# Patient Record
Sex: Female | Born: 1955 | Race: Black or African American | Hispanic: No | Marital: Single | Smoking: Current every day smoker
Health system: Southern US, Community
[De-identification: ages and names within clinical notes are randomized; demographics above are authoritative.]

## PROBLEM LIST (undated history)

## (undated) DIAGNOSIS — I10 Essential (primary) hypertension: Secondary | ICD-10-CM

## (undated) HISTORY — PX: CHOLECYSTECTOMY: SHX55

## (undated) HISTORY — PX: BRAIN SURGERY: SHX531

---

## 2014-09-23 ENCOUNTER — Emergency Department
Admit: 2014-09-23 | Disposition: A | Discharge status: Home / Self Care | Payer: Self-pay | Admitting: Emergency Medicine

## 2014-09-23 LAB — CBC WITH DIFFERENTIAL/PLATELET
Basophil #: 0 10*3/uL (ref 0.0–0.1)
Basophil %: 0.6 %
Eosinophil #: 0.1 10*3/uL (ref 0.0–0.7)
Eosinophil %: 1 %
HCT: 39.8 % (ref 35.0–47.0)
HGB: 13.9 g/dL (ref 12.0–16.0)
LYMPHS PCT: 31.6 %
Lymphocyte #: 1.7 10*3/uL (ref 1.0–3.6)
MCH: 27.3 pg (ref 26.0–34.0)
MCHC: 34.9 g/dL (ref 32.0–36.0)
MCV: 78 fL — ABNORMAL LOW (ref 80–100)
MONO ABS: 0.5 x10 3/mm (ref 0.2–0.9)
MONOS PCT: 8.5 %
NEUTROS PCT: 58.3 %
Neutrophil #: 3.1 10*3/uL (ref 1.4–6.5)
PLATELETS: 148 10*3/uL — AB (ref 150–440)
RBC: 5.09 10*6/uL (ref 3.80–5.20)
RDW: 17.7 % — AB (ref 11.5–14.5)
WBC: 5.4 10*3/uL (ref 3.6–11.0)

## 2014-09-23 LAB — URINALYSIS, COMPLETE
Bacteria: NONE SEEN
Bilirubin,UR: NEGATIVE
Blood: NEGATIVE
Glucose,UR: NEGATIVE mg/dL (ref 0–75)
KETONE: NEGATIVE
Leukocyte Esterase: NEGATIVE
Nitrite: POSITIVE
PH: 7 (ref 4.5–8.0)
Protein: NEGATIVE
SPECIFIC GRAVITY: 1.01 (ref 1.003–1.030)

## 2014-09-23 LAB — BASIC METABOLIC PANEL
ANION GAP: 8 (ref 7–16)
CHLORIDE: 105 mmol/L
CREATININE: 0.61 mg/dL
Calcium, Total: 9.2 mg/dL
Co2: 28 mmol/L
EGFR (African American): >60
EGFR (Non-African Amer.): >60
Glucose: 104 mg/dL — ABNORMAL HIGH
Potassium: 3.4 mmol/L — ABNORMAL LOW
SODIUM: 141 mmol/L

## 2014-11-06 ENCOUNTER — Emergency Department
Admission: EM | Admit: 2014-11-06 | Discharge: 2014-11-07 | Disposition: A | Discharge status: Home / Self Care | Payer: Medicaid Other | Attending: Emergency Medicine | Admitting: Emergency Medicine

## 2014-11-06 ENCOUNTER — Emergency Department: Payer: Medicaid Other

## 2014-11-06 DIAGNOSIS — N8320 Unspecified ovarian cysts: Secondary | ICD-10-CM | POA: Diagnosis not present

## 2014-11-06 DIAGNOSIS — R102 Pelvic and perineal pain: Secondary | ICD-10-CM

## 2014-11-06 DIAGNOSIS — R1031 Right lower quadrant pain: Secondary | ICD-10-CM | POA: Diagnosis present

## 2014-11-06 DIAGNOSIS — Z720 Tobacco use: Secondary | ICD-10-CM | POA: Diagnosis not present

## 2014-11-06 DIAGNOSIS — I10 Essential (primary) hypertension: Secondary | ICD-10-CM | POA: Insufficient documentation

## 2014-11-06 DIAGNOSIS — N83201 Unspecified ovarian cyst, right side: Secondary | ICD-10-CM

## 2014-11-06 HISTORY — DX: Essential (primary) hypertension: I10

## 2014-11-06 LAB — URINALYSIS COMPLETE WITH MICROSCOPIC (ARMC ONLY)
Bilirubin Urine: NEGATIVE
GLUCOSE, UA: NEGATIVE mg/dL
HGB URINE DIPSTICK: NEGATIVE
Ketones, ur: NEGATIVE mg/dL
Leukocytes, UA: NEGATIVE
Nitrite: NEGATIVE
PH: 7 (ref 5.0–8.0)
Protein, ur: 30 mg/dL — AB
Specific Gravity, Urine: 1.012 (ref 1.005–1.030)
Trans Epithel, UA: <1

## 2014-11-06 LAB — BASIC METABOLIC PANEL
Anion gap: 8 (ref 5–15)
BUN: 8 mg/dL (ref 6–20)
CO2: 27 mmol/L (ref 22–32)
Calcium: 10.3 mg/dL (ref 8.9–10.3)
Chloride: 107 mmol/L (ref 101–111)
Creatinine, Ser: 0.8 mg/dL (ref 0.44–1.00)
GFR calc Af Amer: >60 mL/min (ref >60)
GLUCOSE: 116 mg/dL — AB (ref 65–99)
POTASSIUM: 3.9 mmol/L (ref 3.5–5.1)
Sodium: 142 mmol/L (ref 135–145)

## 2014-11-06 LAB — CBC WITH DIFFERENTIAL/PLATELET
BASOS ABS: 0.4 10*3/uL — AB (ref 0–0.1)
Basophils Relative: 5 %
EOS ABS: 0.1 10*3/uL (ref 0–0.7)
EOS PCT: 1 %
HCT: 43.2 % (ref 35.0–47.0)
HEMOGLOBIN: 14 g/dL (ref 12.0–16.0)
Lymphocytes Relative: 17 %
Lymphs Abs: 1.4 10*3/uL (ref 1.0–3.6)
MCH: 26.6 pg (ref 26.0–34.0)
MCHC: 32.5 g/dL (ref 32.0–36.0)
MCV: 81.6 fL (ref 80.0–100.0)
Monocytes Absolute: 0.5 10*3/uL (ref 0.2–0.9)
Monocytes Relative: 5 %
Neutro Abs: 6.2 10*3/uL (ref 1.4–6.5)
Neutrophils Relative %: 72 %
Platelets: 151 10*3/uL (ref 150–440)
RBC: 5.29 MIL/uL — ABNORMAL HIGH (ref 3.80–5.20)
RDW: 16.7 % — AB (ref 11.5–14.5)
WBC: 8.6 10*3/uL (ref 3.6–11.0)

## 2014-11-06 LAB — HEPATIC FUNCTION PANEL
ALT: 17 U/L (ref 14–54)
AST: 18 U/L (ref 15–41)
Albumin: 3.9 g/dL (ref 3.5–5.0)
Alkaline Phosphatase: 100 U/L (ref 38–126)
Bilirubin, Direct: <0.1 mg/dL — ABNORMAL LOW (ref 0.1–0.5)
TOTAL PROTEIN: 8.3 g/dL — AB (ref 6.5–8.1)
Total Bilirubin: 0.5 mg/dL (ref 0.3–1.2)

## 2014-11-06 LAB — LIPASE, BLOOD: Lipase: 49 U/L (ref 22–51)

## 2014-11-06 MED ORDER — ONDANSETRON HCL 4 MG/2ML IJ SOLN
INTRAMUSCULAR | Status: AC
  Administered 2014-11-06: 4 mg via INTRAVENOUS
  Filled 2014-11-06: qty 2

## 2014-11-06 MED ORDER — SODIUM CHLORIDE 0.9 % IV BOLUS (SEPSIS)
1000.0000 mL | Freq: Once | INTRAVENOUS | Status: AC
  Administered 2014-11-06: 1000 mL via INTRAVENOUS

## 2014-11-06 MED ORDER — MORPHINE SULFATE 4 MG/ML IJ SOLN
4.0000 mg | Freq: Once | INTRAMUSCULAR | Status: AC
  Administered 2014-11-06: 4 mg via INTRAVENOUS

## 2014-11-06 MED ORDER — MORPHINE SULFATE 4 MG/ML IJ SOLN
INTRAMUSCULAR | Status: AC
  Administered 2014-11-06: 4 mg via INTRAVENOUS
  Filled 2014-11-06: qty 1

## 2014-11-06 MED ORDER — IOHEXOL 240 MG/ML SOLN
25.0000 mL | Freq: Once | INTRAMUSCULAR | Status: AC | PRN
  Administered 2014-11-06: 25 mL via ORAL

## 2014-11-06 MED ORDER — ONDANSETRON HCL 4 MG/2ML IJ SOLN
4.0000 mg | INTRAMUSCULAR | Status: AC
  Administered 2014-11-06: 4 mg via INTRAVENOUS

## 2014-11-06 NOTE — ED Notes (Signed)
Ultrasound called and informed pt is ready for ultrasound.
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
## 2014-11-06 NOTE — ED Notes (Signed)
Pt refusing to drink last 142ml of contrast. CT contacted and verbalized scan can performed with amount of contrast drank to this point.

## 2014-11-06 NOTE — ED Notes (Signed)
Pt c/o lower abd pain , worse on the right side that started this morning..has started vomiting in the past hour..states she was here recently with same pain and dx with ovarian cyst..

## 2014-11-06 NOTE — ED Notes (Signed)
Pt continues to place CT contrast drink off to the side and this RN continuously reminds pt to continue drinking fluid. Pt will continue to work on finishing drink.

## 2014-11-06 NOTE — ED Notes (Signed)
Pt sleeping in bed at this time. No acute distress noted. RN outside of room with eyes on pt. MD laid eyes on pt. Pt reportedly attempted to crawl from bed. Door will be kept open. Pt in front of nursing station.

## 2014-11-06 NOTE — ED Notes (Signed)
pts daughter called by this RN to give update. Daughter spoke with pt and prompted pt to continue drinking.

## 2014-11-07 ENCOUNTER — Emergency Department: Payer: Medicaid Other

## 2014-11-07 ENCOUNTER — Encounter: Payer: Self-pay | Admitting: Radiology

## 2014-11-07 ENCOUNTER — Other Ambulatory Visit: Payer: Medicaid Other

## 2014-11-07 MED ORDER — IOHEXOL 300 MG/ML  SOLN
100.0000 mL | Freq: Once | INTRAMUSCULAR | Status: AC | PRN
  Administered 2014-11-07: 100 mL via INTRAVENOUS

## 2014-11-07 MED ORDER — MORPHINE SULFATE 4 MG/ML IJ SOLN
4.0000 mg | Freq: Once | INTRAMUSCULAR | Status: AC
  Administered 2014-11-07: 4 mg via INTRAVENOUS

## 2014-11-07 MED ORDER — MORPHINE SULFATE 4 MG/ML IJ SOLN
INTRAMUSCULAR | Status: AC
  Administered 2014-11-07: 4 mg via INTRAVENOUS
  Filled 2014-11-07: qty 1

## 2014-11-07 NOTE — ED Provider Notes (Signed)
-----------------------------------------   2:58 AM on 11/07/2014 -----------------------------------------  Workup including ultrasound and CT unremarkable for acute pathology except for a 6 cm right ovarian cysts which is likely leaking and thick source of her pain. We'll refer her to gynecology for continued management of her symptomatic ovarian cyst. No evidence of torsion appendicitis peritonitis or other surgical pathology.  Carrie Mew, MD 11/07/14 [phone]

## 2014-11-07 NOTE — Discharge Instructions (Signed)
Abdominal Pain, Women °Abdominal (stomach, pelvic, or belly) pain can be caused by many things. It is important to tell your doctor: °· The location of the pain. °· Does it come and go or is it present all the time? °· Are there things that start the pain (eating certain foods, exercise)? °· Are there other symptoms associated with the pain (fever, nausea, vomiting, diarrhea)? °All of this is helpful to know when trying to find the cause of the pain. °CAUSES  °· Stomach: virus or bacteria infection, or ulcer. °· Intestine: appendicitis (inflamed appendix), regional ileitis (Crohn's disease), ulcerative colitis (inflamed colon), irritable bowel syndrome, diverticulitis (inflamed diverticulum of the colon), or cancer of the stomach or intestine. °· Gallbladder disease or stones in the gallbladder. °· Kidney disease, kidney stones, or infection. °· Pancreas infection or cancer. °· Fibromyalgia (pain disorder). °· Diseases of the female organs: °· Uterus: fibroid (non-cancerous) tumors or infection. °· Fallopian tubes: infection or tubal pregnancy. °· Ovary: cysts or tumors. °· Pelvic adhesions (scar tissue). °· Endometriosis (uterus lining tissue growing in the pelvis and on the pelvic organs). °· Pelvic congestion syndrome (female organs filling up with blood just before the menstrual period). °· Pain with the menstrual period. °· Pain with ovulation (producing an egg). °· Pain with an IUD (intrauterine device, birth control) in the uterus. °· Cancer of the female organs. °· Functional pain (pain not caused by a disease, may improve without treatment). °· Psychological pain. °· Depression. °DIAGNOSIS  °Your doctor will decide the seriousness of your pain by doing an examination. °· Blood tests. °· X-rays. °· Ultrasound. °· CT scan (computed tomography, special type of X-ray). °· MRI (magnetic resonance imaging). °· Cultures, for infection. °· Barium enema (dye inserted in the large intestine, to better view it with  X-rays). °· Colonoscopy (looking in intestine with a lighted tube). °· Laparoscopy (minor surgery, looking in abdomen with a lighted tube). °· Major abdominal exploratory surgery (looking in abdomen with a large incision). °TREATMENT  °The treatment will depend on the cause of the pain.  °· Many cases can be observed and treated at home. °· Over-the-counter medicines recommended by your caregiver. °· Prescription medicine. °· Antibiotics, for infection. °· Birth control pills, for painful periods or for ovulation pain. °· Hormone treatment, for endometriosis. °· Nerve blocking injections. °· Physical therapy. °· Antidepressants. °· Counseling with a psychologist or psychiatrist. °· Minor or major surgery. °HOME CARE INSTRUCTIONS  °· Do not take laxatives, unless directed by your caregiver. °· Take over-the-counter pain medicine only if ordered by your caregiver. Do not take aspirin because it can cause an upset stomach or bleeding. °· Try a clear liquid diet (broth or water) as ordered by your caregiver. Slowly move to a bland diet, as tolerated, if the pain is related to the stomach or intestine. °· Have a thermometer and take your temperature several times a day, and record it. °· Bed rest and sleep, if it helps the pain. °· Avoid sexual intercourse, if it causes pain. °· Avoid stressful situations. °· Keep your follow-up appointments and tests, as your caregiver orders. °· If the pain does not go away with medicine or surgery, you may try: °· Acupuncture. °· Relaxation exercises (yoga, meditation). °· Group therapy. °· Counseling. °SEEK MEDICAL CARE IF:  °· You notice certain foods cause stomach pain. °· Your home care treatment is not helping your pain. °· You need stronger pain medicine. °· You want your IUD removed. °· You feel faint or   not go away with medicine or surgery, you may try:   Acupuncture.   Relaxation exercises (yoga, meditation).   Group therapy.   Counseling.  SEEK MEDICAL CARE IF:    You notice certain foods cause stomach pain.   Your home care treatment is not helping your pain.   You need stronger pain medicine.   You want your IUD removed.   You feel faint or lightheaded.   You develop nausea and vomiting.   You develop a rash.   You are having side effects or an allergy to your medicine.  SEEK IMMEDIATE MEDICAL CARE IF:    Your  pain does not go away or gets worse.   You have a fever.   Your pain is felt only in portions of the abdomen. The right side could possibly be appendicitis. The left lower portion of the abdomen could be colitis or diverticulitis.   You are passing blood in your stools (bright red or black tarry stools, with or without vomiting).   You have blood in your urine.   You develop chills, with or without a fever.   You pass out.  MAKE SURE YOU:    Understand these instructions.   Will watch your condition.   Will get help right away if you are not doing well or get worse.  Document Released: 03/18/2007 Document Revised: 10/05/2013 Document Reviewed: 04/07/2009  [employer] Patient Information 2015 [employer]. This information is not intended to replace advice given to you by your health care provider. Make sure you discuss any questions you have with your health care provider.          Ovarian Cyst  An ovarian cyst is a fluid-filled sac that forms on an ovary. The ovaries are small organs that produce eggs in women. Various types of cysts can form on the ovaries. Most are not cancerous. Many do not cause problems, and they often go away on their own. Some may cause symptoms and require treatment. Common types of ovarian cysts include:   Functional cysts--These cysts may occur every month during the menstrual cycle. This is normal. The cysts usually go away with the next menstrual cycle if the woman does not get pregnant. Usually, there are no symptoms with a functional cyst.   Endometrioma cysts--These cysts form from the tissue that lines the uterus. They are also called "chocolate cysts" because they become filled with blood that turns [place]. This type of cyst can cause pain in the lower abdomen during intercourse and with your menstrual period.   Cystadenoma cysts--This type develops from the cells on the outside of the ovary. These cysts can get very big and cause lower abdomen pain and pain with  intercourse. This type of cyst can twist on itself, cut off its blood supply, and cause severe pain. It can also easily rupture and cause a lot of pain.   Dermoid cysts--This type of cyst is sometimes found in both ovaries. These cysts may contain different kinds of body tissue, such as skin, teeth, hair, or cartilage. They usually do not cause symptoms unless they get very big.   Theca lutein cysts--These cysts occur when too much of a certain hormone (human chorionic gonadotropin) is produced and overstimulates the ovaries to produce an egg. This is most common after procedures used to assist with the conception of a baby (in vitro fertilization).  CAUSES    Fertility drugs can cause a condition in which multiple large cysts are formed on the   ovaries. This is called ovarian hyperstimulation syndrome.   A condition called polycystic ovary syndrome can cause hormonal imbalances that can lead to nonfunctional ovarian cysts.  SIGNS AND SYMPTOMS   Many ovarian cysts do not cause symptoms. If symptoms are present, they may include:   Pelvic pain or pressure.   Pain in the lower abdomen.   Pain during sexual intercourse.   Increasing girth (swelling) of the abdomen.   Abnormal menstrual periods.   Increasing pain with menstrual periods.   Stopping having menstrual periods without being pregnant.  DIAGNOSIS   These cysts are commonly found during a routine or annual pelvic exam. Tests may be ordered to find out more about the cyst. These tests may include:   Ultrasound.   X-ray of the pelvis.   CT scan.   MRI.   Blood tests.  TREATMENT   Many ovarian cysts go away on their own without treatment. Your health care provider may want to check your cyst regularly for 2-3 months to see if it changes. For women in menopause, it is particularly important to monitor a cyst closely because of the higher rate of ovarian cancer in menopausal women. When treatment is needed, it may include any of the following:   A  procedure to drain the cyst (aspiration). This may be done using a long needle and ultrasound. It can also be done through a laparoscopic procedure. This involves using a thin, lighted tube with a tiny camera on the end (laparoscope) inserted through a small incision.   Surgery to remove the whole cyst. This may be done using laparoscopic surgery or an open surgery involving a larger incision in the lower abdomen.   Hormone treatment or birth control pills. These methods are sometimes used to help dissolve a cyst.  HOME CARE INSTRUCTIONS    Only take over-the-counter or prescription medicines as directed by your health care provider.   Follow up with your health care provider as directed.   Get regular pelvic exams and Pap tests.  SEEK MEDICAL CARE IF:    Your periods are late, irregular, or painful, or they stop.   Your pelvic pain or abdominal pain does not go away.   Your abdomen becomes larger or swollen.   You have pressure on your bladder or trouble emptying your bladder completely.   You have pain during sexual intercourse.   You have feelings of fullness, pressure, or discomfort in your stomach.   You lose weight for no apparent reason.   You feel generally ill.   You become constipated.   You lose your appetite.   You develop acne.   You have an increase in body and facial hair.   You are gaining weight, without changing your exercise and eating habits.   You think you are pregnant.  SEEK IMMEDIATE MEDICAL CARE IF:    You have increasing abdominal pain.   You feel sick to your stomach (nauseous), and you throw up (vomit).   You develop a fever that comes on suddenly.   You have abdominal pain during a bowel movement.   Your menstrual periods become heavier than usual.  MAKE SURE YOU:   Understand these instructions.   Will watch your condition.   Will get help right away if you are not doing well or get worse.  Document Released: 05/21/2005 Document Revised: 05/26/2013 Document  Reviewed: 01/26/2013  [employer] Patient Information 2015 [employer]. This information is not intended to replace advice given to you by

## 2014-11-07 NOTE — ED Notes (Signed)
Pt assisted up to commode for void.

## 2015-06-27 ENCOUNTER — Ambulatory Visit
Admission: RE | Admit: 2015-06-27 | Discharge: 2015-06-27 | Disposition: A | Discharge status: Home / Self Care | Payer: Medicaid Other | Source: Ambulatory Visit | Attending: Family Medicine | Admitting: Family Medicine

## 2015-06-27 ENCOUNTER — Other Ambulatory Visit: Payer: Self-pay | Admitting: Family Medicine

## 2015-06-27 DIAGNOSIS — R937 Abnormal findings on diagnostic imaging of other parts of musculoskeletal system: Secondary | ICD-10-CM | POA: Diagnosis not present

## 2015-06-27 DIAGNOSIS — M546 Pain in thoracic spine: Secondary | ICD-10-CM

## 2016-07-06 ENCOUNTER — Other Ambulatory Visit: Payer: Self-pay | Admitting: Family Medicine

## 2016-07-06 DIAGNOSIS — Z1231 Encounter for screening mammogram for malignant neoplasm of breast: Secondary | ICD-10-CM

## 2016-08-14 ENCOUNTER — Ambulatory Visit: Payer: Medicaid Other

## 2016-09-10 ENCOUNTER — Ambulatory Visit
Admission: RE | Admit: 2016-09-10 | Discharge: 2016-09-10 | Disposition: A | Discharge status: Home / Self Care | Payer: Medicaid Other | Source: Ambulatory Visit | Attending: Family Medicine | Admitting: Family Medicine

## 2016-09-10 DIAGNOSIS — Z1231 Encounter for screening mammogram for malignant neoplasm of breast: Secondary | ICD-10-CM | POA: Insufficient documentation

## 2016-09-12 ENCOUNTER — Other Ambulatory Visit: Payer: Self-pay | Admitting: *Deleted

## 2016-09-12 ENCOUNTER — Inpatient Hospital Stay
Admission: RE | Admit: 2016-09-12 | Discharge: 2016-09-12 | Disposition: A | Discharge status: Home / Self Care | Payer: Self-pay | Source: Ambulatory Visit | Attending: *Deleted | Admitting: *Deleted

## 2016-09-12 DIAGNOSIS — Z1231 Encounter for screening mammogram for malignant neoplasm of breast: Secondary | ICD-10-CM

## 2017-05-20 ENCOUNTER — Encounter: Payer: Self-pay | Admitting: *Deleted

## 2017-05-21 ENCOUNTER — Encounter
Admission: RE | Disposition: A | Discharge status: Home / Self Care | Payer: Self-pay | Source: Ambulatory Visit | Attending: Gastroenterology

## 2017-05-21 ENCOUNTER — Ambulatory Visit: Payer: Medicaid Other | Admitting: Certified Registered Nurse Anesthetist

## 2017-05-21 ENCOUNTER — Ambulatory Visit
Admission: RE | Admit: 2017-05-21 | Discharge: 2017-05-21 | Disposition: A | Discharge status: Home / Self Care | Payer: Medicaid Other | Source: Ambulatory Visit | Attending: Gastroenterology | Admitting: Gastroenterology

## 2017-05-21 ENCOUNTER — Encounter: Payer: Self-pay | Admitting: Anesthesiology

## 2017-05-21 DIAGNOSIS — K5730 Diverticulosis of large intestine without perforation or abscess without bleeding: Secondary | ICD-10-CM | POA: Diagnosis not present

## 2017-05-21 DIAGNOSIS — Z79899 Other long term (current) drug therapy: Secondary | ICD-10-CM | POA: Insufficient documentation

## 2017-05-21 DIAGNOSIS — D123 Benign neoplasm of transverse colon: Secondary | ICD-10-CM | POA: Diagnosis not present

## 2017-05-21 DIAGNOSIS — Z1211 Encounter for screening for malignant neoplasm of colon: Secondary | ICD-10-CM | POA: Diagnosis not present

## 2017-05-21 DIAGNOSIS — Z86010 Personal history of colonic polyps: Secondary | ICD-10-CM | POA: Insufficient documentation

## 2017-05-21 DIAGNOSIS — I10 Essential (primary) hypertension: Secondary | ICD-10-CM | POA: Insufficient documentation

## 2017-05-21 DIAGNOSIS — D120 Benign neoplasm of cecum: Secondary | ICD-10-CM | POA: Diagnosis not present

## 2017-05-21 DIAGNOSIS — F17200 Nicotine dependence, unspecified, uncomplicated: Secondary | ICD-10-CM | POA: Diagnosis not present

## 2017-05-21 DIAGNOSIS — Z7982 Long term (current) use of aspirin: Secondary | ICD-10-CM | POA: Insufficient documentation

## 2017-05-21 HISTORY — PX: COLONOSCOPY WITH PROPOFOL: SHX5780

## 2017-05-21 SURGERY — COLONOSCOPY WITH PROPOFOL
Anesthesia: General

## 2017-05-21 MED ORDER — SODIUM CHLORIDE 0.9 % IV SOLN
INTRAVENOUS | Status: DC
  Administered 2017-05-21: 15:00:00 via INTRAVENOUS

## 2017-05-21 MED ORDER — PROPOFOL 10 MG/ML IV BOLUS
INTRAVENOUS | Status: DC | PRN
  Administered 2017-05-21: 30 mg via INTRAVENOUS
  Administered 2017-05-21: 20 mg via INTRAVENOUS

## 2017-05-21 MED ORDER — LIDOCAINE HCL (PF) 2 % IJ SOLN
INTRAMUSCULAR | Status: AC
Start: 2017-05-21 — End: 2017-05-21
  Filled 2017-05-21: qty 10

## 2017-05-21 MED ORDER — PROPOFOL 10 MG/ML IV BOLUS
INTRAVENOUS | Status: AC
  Filled 2017-05-21: qty 20

## 2017-05-21 MED ORDER — LIDOCAINE HCL (PF) 2 % IJ SOLN
INTRAMUSCULAR | Status: DC | PRN
  Administered 2017-05-21: 60 mg

## 2017-05-21 MED ORDER — MIDAZOLAM HCL 2 MG/2ML IJ SOLN
INTRAMUSCULAR | Status: AC
  Filled 2017-05-21: qty 2

## 2017-05-21 MED ORDER — MIDAZOLAM HCL 5 MG/5ML IJ SOLN
INTRAMUSCULAR | Status: AC
  Filled 2017-05-21: qty 5

## 2017-05-21 MED ORDER — MIDAZOLAM HCL 5 MG/5ML IJ SOLN
INTRAMUSCULAR | Status: DC | PRN
  Administered 2017-05-21: 2 mg via INTRAVENOUS
  Administered 2017-05-21: 1 mg via INTRAVENOUS

## 2017-05-21 MED ORDER — PROPOFOL 500 MG/50ML IV EMUL
INTRAVENOUS | Status: DC | PRN
  Administered 2017-05-21: 50 ug/kg/min via INTRAVENOUS

## 2017-05-21 NOTE — Anesthesia Post-op Follow-up Note (Signed)
Anesthesia QCD[facility] form completed.        

## 2017-05-21 NOTE — H&P (Signed)
Outpatient short stay form Pre-procedure 05/21/2017 2:49 PM Lollie Sails MD  Primary Physician: Dr Windle Guard  Reason for visit:  Colonoscopy  History of present illness:  Patient is a 61 year old female presenting today for colonoscopy in regards to her personal history of adenomatous colon polyps. Her last colonoscopy was in 2013. She tolerated her prep well. She takes no aspirin or blood thinning agent. There is a 81 mg aspirin listed as a medication.    Current Facility-Administered Medications:  .  0.9 %  sodium chloride infusion, , Intravenous, Continuous, Lollie Sails, MD  Medications Prior to Admission  Medication Sig Dispense Refill Last Dose  . atorvastatin (LIPITOR) 40 MG tablet Take 40 mg by mouth daily.   05/20/2017 at Unknown time  . citalopram (CELEXA) 20 MG tablet Take 20 mg by mouth daily.   05/19/2017 at Unknown time  . docusate sodium (COLACE) 100 MG capsule Take 100 mg by mouth 2 (two) times daily.   Past Month at Unknown time  . Fluticasone-Salmeterol (ADVAIR) 250-50 MCG/DOSE AEPB Inhale 1 puff into the lungs every 12 (twelve) hours.   Past Month at Unknown time  . gabapentin (NEURONTIN) 300 MG capsule Take 600 mg by mouth 3 (three) times daily.   05/20/2017 at Unknown time  . polyethylene glycol (MIRALAX / GLYCOLAX) packet Take 17 g by mouth daily.   Past Month at Unknown time  . traZODone (DESYREL) 50 MG tablet Take 50 mg by mouth at bedtime.   05/20/2017 at Unknown time  . aspirin EC 81 MG tablet Take 81 mg by mouth daily.   Not Taking at Unknown time  . tazarotene (TAZORAC) 0.05 % cream Apply topically 2 (two) times daily.   Not Taking at Unknown time     No Known Allergies   Past Medical History:  Diagnosis Date  . Hypertension     Review of systems:      Physical Exam    Heart and lungs: Regular rate and rhythm without rub or gallop, lungs are bilaterally clear    HEENT: Normocephalic atraumatic eyes are anicteric    Other:    Pertinant exam for procedure: Soft nontender nondistended bowel sounds positive normoactive    Planned proceedures: Colonoscopy and indicated procedures. I have discussed the risks benefits and complications of procedures to include not limited to bleeding, infection, perforation and the risk of sedation and the patient wishes to proceed.    Lollie Sails, MD Gastroenterology 05/21/2017  2:49 PM

## 2017-05-21 NOTE — Op Note (Signed)
[facility] Gastroenterology Patient Name: Jasmine [name] Procedure Date: 05/21/2017 2:46 PM MRN: [id number] Account #: [id number] Date of Birth: 18-Apr-1956 Admit Type: Outpatient Age: 61 Room: [facility] ENDO ROOM 3 Gender: Female Note Status: Finalized Procedure:            Colonoscopy Indications:          Personal history of colonic polyps Providers:            Lollie Sails, MD Referring MD:         Ngwe A. Aycock MD (Referring MD) Medicines:            Monitored Anesthesia Care Complications:        No immediate complications. Procedure:            Pre-Anesthesia Assessment:                       - ASA Grade Assessment: III - A patient with severe                        systemic disease.                       After obtaining informed consent, the colonoscope was                        passed under direct vision. Throughout the procedure,                        the patient's blood pressure, pulse, and oxygen                        saturations were monitored continuously. The                        Colonoscope was introduced through the anus and                        advanced to the the cecum, identified by appendiceal                        orifice and ileocecal valve. The colonoscopy was                        unusually difficult due to a tortuous colon. Successful                        completion of the procedure was aided by using manual                        pressure. The patient tolerated the procedure well. Findings:      A few small-mouthed diverticula were found in the sigmoid colon and       descending colon.      A 5 mm polyp was found in the cecum. The polyp was semi-pedunculated.       Polypectomy was attempted, initially using a cold snare. Polyp resection       was incomplete with this device. This intervention then required a       different device and polypectomy technique. The polyp was removed with a       cold biopsy forceps. Resection  and  retrieval were complete.      A 16 mm polyp was found in the hepatic flexure. The polyp was       pedunculated. The polyp was removed with a hot snare. Resection and       retrieval were complete. To prevent bleeding after the polypectomy, two       hemostatic clips were successfully placed (MR conditional). There was no       bleeding at the end of the maneuver.      The digital rectal exam was normal. Impression:           - Diverticulosis in the sigmoid colon and in the                        descending colon.                       - One 5 mm polyp in the cecum, removed with a cold                        biopsy forceps. Resected and retrieved.                       - One 16 mm polyp at the hepatic flexure, removed with                        a hot snare. Resected and retrieved. Clips (MR                        conditional) were placed. Recommendation:       - Full liquid diet for 1 day, then advance as tolerated                        to low residue diet for 3 days. Procedure Code(s):    --- Professional ---                       [phone], Colonoscopy, flexible; with removal of tumor(s),                        polyp(s), or other lesion(s) by snare technique                       45380, 47, Colonoscopy, flexible; with biopsy, single                        or multiple Diagnosis Code(s):    --- Professional ---                       D12.0, Benign neoplasm of cecum                       D12.3, Benign neoplasm of transverse colon (hepatic                        flexure or splenic flexure)                       Z86.010, Personal history of colonic polyps  K57.30, Diverticulosis of large intestine without                        perforation or abscess without bleeding CPT copyright 2016 American Medical Association. All rights reserved. The codes documented in this report are preliminary and upon coder review may  be revised to meet current compliance  requirements. Lollie Sails, MD 05/21/2017 3:46:53 PM This report has been signed electronically. Number of Addenda: 0 Note Initiated On: 05/21/2017 2:46 PM Scope Withdrawal Time: 0 hours 29 minutes 45 seconds  Total Procedure Duration: 0 hours 46 minutes 8 seconds       [facility]

## 2017-05-21 NOTE — Transfer of Care (Signed)
Immediate Anesthesia Transfer of Care Note  Patient: Jasmine [name]  Procedure(s) Performed: Procedure(s): COLONOSCOPY WITH PROPOFOL (N/A)  Patient Location: PACU  Anesthesia Type:General  Level of Consciousness: sedated  Airway & Oxygen Therapy: Patient Spontanous Breathing and Patient connected to face mask oxygen  Post-op Assessment: Report given to RN and Post -op Vital signs reviewed and stable  Post vital signs: Reviewed and stable  Last Vitals:  Vitals:   05/21/17 1410 05/21/17 1548  BP: (!) 146/92 129/73  Pulse: 81 70  Resp: 14 19  Temp: 36.4 C (!) 36.1 C  SpO2: 11% 02%    Complications: No apparent anesthesia complications
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
## 2017-05-22 ENCOUNTER — Encounter: Payer: Self-pay | Admitting: Gastroenterology

## 2017-05-22 NOTE — Anesthesia Postprocedure Evaluation (Signed)
Anesthesia Post Note  Patient: Jasmine [name]  Procedure(s) Performed: COLONOSCOPY WITH PROPOFOL (N/A )  Patient location during evaluation: Endoscopy Anesthesia Type: General Level of consciousness: awake and alert Pain management: pain level controlled Vital Signs Assessment: post-procedure vital signs reviewed and stable Respiratory status: spontaneous breathing, nonlabored ventilation, respiratory function stable and patient connected to nasal cannula oxygen Cardiovascular status: blood pressure returned to baseline and stable Postop Assessment: no apparent nausea or vomiting Anesthetic complications: no     Last Vitals:  Vitals:   05/21/17 1558 05/21/17 1608  BP: (!) 174/106 (!) 154/101  Pulse:    Resp:    Temp:    SpO2:      Last Pain:  Vitals:   05/21/17 1548  TempSrc: Tympanic  PainSc:                  Roniqua Kintz S

## 2017-05-24 LAB — SURGICAL PATHOLOGY

## 2017-09-16 ENCOUNTER — Ambulatory Visit: Payer: Self-pay | Admitting: Urology

## 2017-09-18 ENCOUNTER — Ambulatory Visit: Payer: Self-pay | Admitting: Urology

## 2017-10-21 ENCOUNTER — Ambulatory Visit (INDEPENDENT_AMBULATORY_CARE_PROVIDER_SITE_OTHER): Payer: Medicaid Other | Admitting: Urology

## 2017-10-21 ENCOUNTER — Encounter: Payer: Self-pay | Admitting: Urology

## 2017-10-21 VITALS — BP 122/85 | HR 77 | Ht 65.0 in | Wt 153.4 lb

## 2017-10-21 DIAGNOSIS — R350 Frequency of micturition: Secondary | ICD-10-CM | POA: Diagnosis not present

## 2017-10-21 LAB — MICROSCOPIC EXAMINATION

## 2017-10-21 LAB — URINALYSIS, COMPLETE
Bilirubin, UA: NEGATIVE
GLUCOSE, UA: NEGATIVE
Ketones, UA: NEGATIVE
NITRITE UA: NEGATIVE
PROTEIN UA: NEGATIVE
RBC UA: NEGATIVE
Specific Gravity, UA: 1.015 (ref 1.005–1.030)
UUROB: 2 mg/dL — AB (ref 0.2–1.0)
pH, UA: 7 (ref 5.0–7.5)

## 2017-10-21 NOTE — Progress Notes (Signed)
10/21/2017 2:44 PM   Jasmine [name] 1955-11-10 [id number]  Referring provider: Donnie Coffin, MD [place], [place] [address]  Chief Complaint  Patient presents with  . Urinary Frequency    HPI: Was consulted to assess the patient's frequent urination.  The story was difficult to ascertain.  She voids every hour and possibly just as frequent at night.  She does not leak or wear pads.  On requestioning I could not tell if she has a good flow but she seems to be double voiding a small amount and she will push at the end of urination.  I believe the symptoms have been present for approximately 1 month.  She does smoke.  She has had a stroke  She denies a history of previous GU surgery and kidney stones.  Modifying factors: There are no other modifying factors  Associated signs and symptoms: There are no other associated signs and symptoms Aggravating and relieving factors: There are no other aggravating or relieving factors Severity: Moderate Duration: Persistent   PMH: Past Medical History:  Diagnosis Date  . Hypertension     Surgical History: Past Surgical History:  Procedure Laterality Date  . BRAIN SURGERY    . CHOLECYSTECTOMY    . COLONOSCOPY WITH PROPOFOL N/A 05/21/2017   Procedure: COLONOSCOPY WITH PROPOFOL;  Surgeon: Lollie Sails, MD;  Location: [facility] ENDOSCOPY;  Service: Endoscopy;  Laterality: N/A;    Home Medications:  Allergies as of 10/21/2017   No Known Allergies     Medication List        Accurate as of 10/21/17  2:44 PM. Always use your most recent med list.          aspirin EC 81 MG tablet Take 81 mg by mouth daily.   atorvastatin 40 MG tablet Commonly known as:  LIPITOR Take 40 mg by mouth daily.   citalopram 20 MG tablet Commonly known as:  CELEXA Take 20 mg by mouth daily.   docusate sodium 100 MG capsule Commonly known as:  COLACE Take 100 mg by mouth 2 (two) times daily.   Fluticasone-Salmeterol 250-50  MCG/DOSE Aepb Commonly known as:  ADVAIR Inhale 1 puff into the lungs every 12 (twelve) hours.   NEURONTIN 300 MG capsule Generic drug:  gabapentin Take 600 mg by mouth 3 (three) times daily.   polyethylene glycol packet Commonly known as:  MIRALAX / GLYCOLAX Take 17 g by mouth daily.   tazarotene 0.05 % cream Commonly known as:  TAZORAC Apply topically 2 (two) times daily.   traZODone 50 MG tablet Commonly known as:  DESYREL Take 50 mg by mouth at bedtime.       Allergies: No Known Allergies  Family History: Family History  Problem Relation Age of Onset  . Breast cancer Neg Hx     Social History:  reports that she has been smoking cigarettes.  She has been smoking about 1.00 pack per day. She has never used smokeless tobacco. She reports that she does not drink alcohol or use drugs.  ROS: UROLOGY Frequent Urination?: Yes Hard to postpone urination?: Yes Burning/pain with urination?: No Get up at night to urinate?: Yes Leakage of urine?: No Urine stream starts and stops?: No Trouble starting stream?: No Do you have to strain to urinate?: No Blood in urine?: No Urinary tract infection?: No Sexually transmitted disease?: No Injury to kidneys or bladder?: No Painful intercourse?: No Weak stream?: No Currently pregnant?: No Vaginal bleeding?: No Last menstrual  period?: n  Gastrointestinal Nausea?: No Vomiting?: No Indigestion/heartburn?: No Diarrhea?: No Constipation?: Yes  Constitutional Fever: No Night sweats?: No Weight loss?: No Fatigue?: Yes  Skin Skin rash/lesions?: No Itching?: No  Eyes Blurred vision?: No Double vision?: No  Ears/Nose/Throat Sore throat?: No Sinus problems?: No  Hematologic/Lymphatic Swollen glands?: No Easy bruising?: No  Cardiovascular Leg swelling?: No Chest pain?: No  Respiratory Cough?: No Shortness of breath?: No  Endocrine Excessive thirst?: No  Musculoskeletal Back pain?: No Joint pain?:  No  Neurological Headaches?: Yes Dizziness?: No  Psychologic Depression?: No Anxiety?: Yes  Physical Exam: BP 122/85 (BP Location: Right Arm, Patient Position: Sitting, Cuff Size: Normal)   Pulse 77   Ht 5\' 5"  (1.651 m)   Wt 153 lb 6.4 oz (69.6 kg)   BMI 25.53 kg/m   Constitutional:  Alert and oriented, No acute distress. HEENT: [place] AT, moist mucus membranes.  Trachea midline, no masses. Cardiovascular: No clubbing, cyanosis, or edema. Respiratory: Normal respiratory effort, no increased work of breathing. GI: Abdomen is soft, nontender, nondistended, no abdominal masses GU:  She may have lichen sclerosis.  Bladder neck well supported.  No prolapse Skin: No rashes, bruises or suspicious lesions. Lymph: No cervical or inguinal adenopathy. Neurologic: Grossly intact, no focal deficits, moving all 4 extremities. Psychiatric: Normal mood and affect.  Laboratory Data: Lab Results  Component Value Date   WBC 8.6 11/06/2014   HGB 14.0 11/06/2014   HCT 43.2 11/06/2014   MCV 81.6 11/06/2014   PLT 151 11/06/2014    Lab Results  Component Value Date   CREATININE 0.80 11/06/2014    No results found for: PSA  No results found for: TESTOSTERONE  No results found for: HGBA1C  Urinalysis    Component Value Date/Time   COLORURINE YELLOW (A) 11/06/2014 1555   APPEARANCEUR CLEAR (A) 11/06/2014 1555   APPEARANCEUR Clear 09/23/2014 1146   LABSPEC 1.012 11/06/2014 1555   LABSPEC 1.010 09/23/2014 1146   PHURINE 7.0 11/06/2014 1555   GLUCOSEU NEGATIVE 11/06/2014 1555   GLUCOSEU Negative 09/23/2014 1146   HGBUR NEGATIVE 11/06/2014 1555   BILIRUBINUR NEGATIVE 11/06/2014 1555   BILIRUBINUR Negative 09/23/2014 1146   KETONESUR NEGATIVE 11/06/2014 1555   PROTEINUR 30 (A) 11/06/2014 1555   NITRITE NEGATIVE 11/06/2014 1555   LEUKOCYTESUR NEGATIVE 11/06/2014 1555   LEUKOCYTESUR Negative 09/23/2014 1146    Pertinent Imaging: None  Assessment & Plan: Patient has a very frequent  bladder.  She had no microscopic hematuria.  I will call the urine cultures positive.  I will see her back in about 5 weeks on Myrbetriq 50 mg samples and perform cystoscopy  There are no diagnoses linked to this encounter.  No follow-ups on file.  Reece Packer, MD  [facility] [address] [place], [place] [address] [phone]

## 2017-10-23 ENCOUNTER — Telehealth: Payer: Self-pay | Admitting: Urology

## 2017-10-23 ENCOUNTER — Other Ambulatory Visit: Payer: Self-pay

## 2017-10-23 LAB — CULTURE, URINE COMPREHENSIVE

## 2017-10-23 NOTE — Telephone Encounter (Signed)
Letter typed and faxed to [phone].

## 2017-10-23 NOTE — Telephone Encounter (Signed)
Patient was seen on Monday and was given samples of myrbetriq but Dr.Macdiarmid never wrote the order for the facility that she lives in to give her the samples. They need an order faxed over to them so that they can give them to her. Please fax an order to [facility] @ [phone] today please   Sharyn Lull

## 2017-11-25 ENCOUNTER — Ambulatory Visit (INDEPENDENT_AMBULATORY_CARE_PROVIDER_SITE_OTHER): Payer: Medicaid Other | Admitting: Urology

## 2017-11-25 ENCOUNTER — Encounter: Payer: Self-pay | Admitting: Urology

## 2017-11-25 VITALS — BP 100/67 | HR 71 | Ht 65.0 in | Wt 150.5 lb

## 2017-11-25 DIAGNOSIS — R350 Frequency of micturition: Secondary | ICD-10-CM | POA: Diagnosis not present

## 2017-11-25 LAB — MICROSCOPIC EXAMINATION
BACTERIA UA: NONE SEEN
RBC, UA: NONE SEEN /hpf (ref 0–2)
WBC, UA: NONE SEEN /hpf (ref 0–5)

## 2017-11-25 LAB — URINALYSIS, COMPLETE
BILIRUBIN UA: NEGATIVE
Glucose, UA: NEGATIVE
Ketones, UA: NEGATIVE
Nitrite, UA: NEGATIVE
Protein, UA: NEGATIVE
RBC UA: NEGATIVE
Specific Gravity, UA: <1.005 — ABNORMAL LOW (ref 1.005–1.030)
Urobilinogen, Ur: 0.2 mg/dL (ref 0.2–1.0)
pH, UA: 7 (ref 5.0–7.5)

## 2017-11-25 MED ORDER — CIPROFLOXACIN HCL 500 MG PO TABS
500.0000 mg | ORAL_TABLET | Freq: Once | ORAL | Status: AC
  Administered 2017-11-25: 500 mg via ORAL

## 2017-11-25 MED ORDER — LIDOCAINE HCL URETHRAL/MUCOSAL 2 % EX GEL
1.0000 "application " | Freq: Once | CUTANEOUS | Status: AC
  Administered 2017-11-25: 1 via URETHRAL

## 2017-11-25 NOTE — Progress Notes (Signed)
11/25/2017 10:48 AM   Jasmine [name] November 06, 1955 [id number]  Referring provider: Donnie Coffin, MD [place], [place] [address]  Chief Complaint  Patient presents with  . Cysto    HPI: Was consulted to assess the patient's frequent urination.  The story was difficult to ascertain.  She voids every hour and possibly just as frequent at night.  She does not leak or wear pads.  On requestioning I could not tell if she has a good flow but she seems to be double voiding a small amount and she will push at the end of urination.  I believe the symptoms have been present for approximately 1 month.  She does smoke.  She has had a stroke  Pelvic normal  Patient has a very frequent bladder.  She had no microscopic hematuria.  I will call the urine cultures positive.  I will see her back in about 5 weeks on Myrbetriq 50 mg samples and perform cystoscopy  Day Frequency unchanged.  Nighttime frequency unchanged.  Clinically not infected  Cystoscopy: After verbal and written consent patient underwent flexible cystoscopy utilizing sterile technique.  Bladder mucosa trigone were normal.  There was no carcinoma or foreign body or cystitis.   PMH: Past Medical History:  Diagnosis Date  . Hypertension     Surgical History: Past Surgical History:  Procedure Laterality Date  . BRAIN SURGERY    . CHOLECYSTECTOMY    . COLONOSCOPY WITH PROPOFOL N/A 05/21/2017   Procedure: COLONOSCOPY WITH PROPOFOL;  Surgeon: Lollie Sails, MD;  Location: [facility] ENDOSCOPY;  Service: Endoscopy;  Laterality: N/A;    Home Medications:  Allergies as of 11/25/2017   No Known Allergies     Medication List        Accurate as of 11/25/17 10:48 AM. Always use your most recent med list.          aspirin EC 81 MG tablet Take 81 mg by mouth daily.   atorvastatin 40 MG tablet Commonly known as:  LIPITOR Take 40 mg by mouth daily.   citalopram 20 MG tablet Commonly known as:  CELEXA Take  20 mg by mouth daily.   docusate sodium 100 MG capsule Commonly known as:  COLACE Take 100 mg by mouth 2 (two) times daily.   Fluticasone-Salmeterol 250-50 MCG/DOSE Aepb Commonly known as:  ADVAIR Inhale 1 puff into the lungs every 12 (twelve) hours.   NEURONTIN 300 MG capsule Generic drug:  gabapentin Take 600 mg by mouth 3 (three) times daily.   polyethylene glycol packet Commonly known as:  MIRALAX / GLYCOLAX Take 17 g by mouth daily.   tazarotene 0.05 % cream Commonly known as:  TAZORAC Apply topically 2 (two) times daily.   traZODone 50 MG tablet Commonly known as:  DESYREL Take 50 mg by mouth at bedtime.       Allergies: No Known Allergies  Family History: Family History  Problem Relation Age of Onset  . Breast cancer Neg Hx     Social History:  reports that she has been smoking cigarettes.  She has been smoking about 1.00 pack per day. She has never used smokeless tobacco. She reports that she does not drink alcohol or use drugs.  ROS:                                        Physical Exam: BP  100/67 (BP Location: Right Arm, Patient Position: Sitting, Cuff Size: Normal)   Pulse 71   Ht 5\' 5"  (1.651 m)   Wt 150 lb 8 oz (68.3 kg)   BMI 25.04 kg/m   Constitutional:  Alert and oriented, No acute distress.   Laboratory Data: Lab Results  Component Value Date   WBC 8.6 11/06/2014   HGB 14.0 11/06/2014   HCT 43.2 11/06/2014   MCV 81.6 11/06/2014   PLT 151 11/06/2014    Lab Results  Component Value Date   CREATININE 0.80 11/06/2014    No results found for: PSA  No results found for: TESTOSTERONE  No results found for: HGBA1C  Urinalysis    Component Value Date/Time   COLORURINE YELLOW (A) 11/06/2014 1555   APPEARANCEUR Clear 10/21/2017 1407   LABSPEC 1.012 11/06/2014 1555   LABSPEC 1.010 09/23/2014 1146   PHURINE 7.0 11/06/2014 1555   GLUCOSEU Negative 10/21/2017 1407   GLUCOSEU Negative 09/23/2014 1146   HGBUR  NEGATIVE 11/06/2014 1555   BILIRUBINUR Negative 10/21/2017 1407   BILIRUBINUR Negative 09/23/2014 1146   KETONESUR NEGATIVE 11/06/2014 1555   PROTEINUR Negative 10/21/2017 1407   PROTEINUR 30 (A) 11/06/2014 1555   NITRITE Negative 10/21/2017 1407   NITRITE NEGATIVE 11/06/2014 1555   LEUKOCYTESUR 2+ (A) 10/21/2017 1407   LEUKOCYTESUR Negative 09/23/2014 1146    Pertinent Imaging:   Assessment & Plan: Patient will be called in oxybutynin ER 10 mg and reassess in 5 weeks.  Percutaneous tibial nerve stimulation and other overactive bladder medications are options.  It may be difficult to reach treatment goal.  Urine culture negative  1. Urinary frequency  - Urinalysis, Complete - ciprofloxacin (CIPRO) tablet 500 mg - lidocaine (XYLOCAINE) 2 % jelly 1 application   No follow-ups on file.  Reece Packer, MD  [facility] [address], [place],  [address] [phone]

## 2017-11-27 ENCOUNTER — Telehealth: Payer: Self-pay | Admitting: Urology

## 2017-11-27 DIAGNOSIS — R350 Frequency of micturition: Secondary | ICD-10-CM

## 2017-11-27 NOTE — Telephone Encounter (Signed)
Ms. Elvis Coil calls from [facility] stating that Ms. Demedeiros is almost out of samples and if Dr. Matilde Sprang wants the pt to continue taking the Mybetriq she really needs a Rx faxed to [facility] in [place]. And please advise Elvis Coil of this at [phone] on her cell. Please advise. Thanks.

## 2017-11-29 MED ORDER — OXYBUTYNIN CHLORIDE ER 10 MG PO TB24: 10.0000 mg | ORAL_TABLET | Freq: Every day | ORAL | 11 refills | Status: DC

## 2017-11-29 NOTE — Telephone Encounter (Signed)
Called pt's caregiver informer her that rx would be sent in as sx are improved with medication samples. RX sent.

## 2017-12-30 ENCOUNTER — Ambulatory Visit: Payer: Medicaid Other

## 2018-01-20 ENCOUNTER — Ambulatory Visit: Payer: Medicaid Other | Admitting: Urology

## 2018-02-10 ENCOUNTER — Ambulatory Visit (INDEPENDENT_AMBULATORY_CARE_PROVIDER_SITE_OTHER): Payer: Medicaid Other | Admitting: Urology

## 2018-02-10 ENCOUNTER — Encounter: Payer: Self-pay | Admitting: Urology

## 2018-02-10 VITALS — BP 119/78 | HR 78 | Ht 65.0 in | Wt 140.0 lb

## 2018-02-10 DIAGNOSIS — R350 Frequency of micturition: Secondary | ICD-10-CM | POA: Diagnosis not present

## 2018-02-10 MED ORDER — TAMSULOSIN HCL 0.4 MG PO CAPS: 0.4000 mg | ORAL_CAPSULE | Freq: Every day | ORAL | 11 refills | Status: DC

## 2018-02-10 NOTE — Addendum Note (Signed)
Addended by: Tommy Rainwater on: 02/10/2018 02:46 PM   Modules accepted: Orders

## 2018-02-10 NOTE — Progress Notes (Signed)
02/10/2018 2:32 PM   Jasmine [name] 02-12-1956 [id number]  Referring provider: Donnie Coffin, MD [name], [place] [id number]  Chief Complaint  Patient presents with  . Urinary Frequency    5wk    HPI: Was consulted to assess the patient's frequent urination.  The story was difficult to ascertain.  She voids every hour and possibly just as frequent at night.  She does not leak or wear pads.  On requestioning I could not tell if she has a good flow but she seems to be double voiding a small amount and she will push at the end of urination.  I believe the symptoms have been present for approximately 1 month.  She does smoke.  She has had a stroke  Patient has a very frequent bladder.  She had no microscopic hematuria.  I will call the urine cultures positive.  I will see her back in about 5 weeks on Myrbetriq 50 mg samples and perform cystoscopy  Today Patient is now on oxybutynin.  In retrospect the Myrbetriq probably worked better.  Oxybutynin did not help.  I do remember now that 1 of her chief complaints is that she needs to urinate and then she has trouble initiating urination.  Sometimes history is difficult to ascertain.  I have not yet performed cystoscopy.  PMH: Past Medical History:  Diagnosis Date  . Hypertension     Surgical History: Past Surgical History:  Procedure Laterality Date  . BRAIN SURGERY    . CHOLECYSTECTOMY    . COLONOSCOPY WITH PROPOFOL N/A 05/21/2017   Procedure: COLONOSCOPY WITH PROPOFOL;  Surgeon: Lollie Sails, MD;  Location: [facility] ENDOSCOPY;  Service: Endoscopy;  Laterality: N/A;    Home Medications:  Allergies as of 02/10/2018   No Known Allergies     Medication List        Accurate as of 02/10/18  2:32 PM. Always use your most recent med list.          aspirin EC 81 MG tablet Take 81 mg by mouth daily.   atorvastatin 40 MG tablet Commonly known as:  LIPITOR Take 40 mg by mouth daily.   citalopram 20 MG  tablet Commonly known as:  CELEXA Take 20 mg by mouth daily.   docusate sodium 100 MG capsule Commonly known as:  COLACE Take 100 mg by mouth 2 (two) times daily.   Fluticasone-Salmeterol 250-50 MCG/DOSE Aepb Commonly known as:  ADVAIR Inhale 1 puff into the lungs every 12 (twelve) hours.   NEURONTIN 300 MG capsule Generic drug:  gabapentin Take 600 mg by mouth 3 (three) times daily.   oxybutynin 10 MG 24 hr tablet Commonly known as:  DITROPAN-XL Take 1 tablet (10 mg total) by mouth daily.   polyethylene glycol packet Commonly known as:  MIRALAX / GLYCOLAX Take 17 g by mouth daily.   tazarotene 0.05 % cream Commonly known as:  TAZORAC Apply topically 2 (two) times daily.   traZODone 50 MG tablet Commonly known as:  DESYREL Take 50 mg by mouth at bedtime.       Allergies: No Known Allergies  Family History: Family History  Problem Relation Age of Onset  . Breast cancer Neg Hx     Social History:  reports that she has been smoking cigarettes. She has been smoking about 1.00 pack per day. She has never used smokeless tobacco. She reports that she does not drink alcohol or use drugs.  ROS: UROLOGY Frequent Urination?:  No Hard to postpone urination?: No Burning/pain with urination?: No Get up at night to urinate?: Yes Leakage of urine?: No Urine stream starts and stops?: Yes Trouble starting stream?: Yes Do you have to strain to urinate?: Yes Blood in urine?: No Urinary tract infection?: No Sexually transmitted disease?: No Injury to kidneys or bladder?: No Painful intercourse?: No Weak stream?: No Currently pregnant?: No Vaginal bleeding?: No Last menstrual period?: n  Gastrointestinal Nausea?: No Vomiting?: No Indigestion/heartburn?: No Diarrhea?: No Constipation?: No  Constitutional Fever: No Night sweats?: No Weight loss?: No Fatigue?: No  Skin Skin rash/lesions?: No Itching?: No  Eyes Blurred vision?: No Double vision?:  No  Ears/Nose/Throat Sore throat?: No Sinus problems?: No  Hematologic/Lymphatic Swollen glands?: No Easy bruising?: No  Cardiovascular Leg swelling?: No Chest pain?: No  Respiratory Cough?: No Shortness of breath?: No  Endocrine Excessive thirst?: No  Musculoskeletal Back pain?: No Joint pain?: No  Neurological Headaches?: Yes Dizziness?: No  Psychologic Depression?: No Anxiety?: No  Physical Exam: BP 119/78   Pulse 78   Ht 5\' 5"  (1.651 m)   Wt 63.5 kg   BMI 23.30 kg/m   Constitutional:  Alert and oriented, No acute distress.   Laboratory Data: Lab Results  Component Value Date   WBC 8.6 11/06/2014   HGB 14.0 11/06/2014   HCT 43.2 11/06/2014   MCV 81.6 11/06/2014   PLT 151 11/06/2014    Lab Results  Component Value Date   CREATININE 0.80 11/06/2014    No results found for: PSA  No results found for: TESTOSTERONE  No results found for: HGBA1C  Urinalysis    Component Value Date/Time   COLORURINE YELLOW (A) 11/06/2014 1555   APPEARANCEUR Clear 11/25/2017 1049   LABSPEC 1.012 11/06/2014 1555   LABSPEC 1.010 09/23/2014 1146   PHURINE 7.0 11/06/2014 1555   GLUCOSEU Negative 11/25/2017 1049   GLUCOSEU Negative 09/23/2014 1146   HGBUR NEGATIVE 11/06/2014 1555   BILIRUBINUR Negative 11/25/2017 1049   BILIRUBINUR Negative 09/23/2014 1146   KETONESUR NEGATIVE 11/06/2014 1555   PROTEINUR Negative 11/25/2017 1049   PROTEINUR 30 (A) 11/06/2014 1555   NITRITE Negative 11/25/2017 1049   NITRITE NEGATIVE 11/06/2014 1555   LEUKOCYTESUR Trace (A) 11/25/2017 1049   LEUKOCYTESUR Negative 09/23/2014 1146    Pertinent Imaging:   Assessment & Plan: The patient will try Flomax 0.4 mg.  When I see her in 5 weeks I will perform cystoscopy.  Intra-vesicle because of symptoms need to be looked into. C/S SENT  There are no diagnoses linked to this encounter.  No follow-ups on file.  Reece Packer, MD  [facility] [address], [place], [place] [address] [phone]

## 2018-02-11 LAB — MICROSCOPIC EXAMINATION
Bacteria, UA: NONE SEEN
Epithelial Cells (non renal): NONE SEEN /hpf (ref 0–10)
RBC, UA: NONE SEEN /hpf (ref 0–2)

## 2018-02-11 LAB — URINALYSIS, COMPLETE
Bilirubin, UA: NEGATIVE
GLUCOSE, UA: NEGATIVE
Ketones, UA: NEGATIVE
NITRITE UA: NEGATIVE
PH UA: 6 (ref 5.0–7.5)
PROTEIN UA: NEGATIVE
RBC, UA: NEGATIVE
Specific Gravity, UA: 1.02 (ref 1.005–1.030)
UUROB: 1 mg/dL (ref 0.2–1.0)

## 2018-02-12 LAB — CULTURE, URINE COMPREHENSIVE

## 2018-02-19 ENCOUNTER — Encounter: Payer: Self-pay | Admitting: *Deleted

## 2018-02-19 ENCOUNTER — Telehealth: Payer: Self-pay | Admitting: *Deleted

## 2018-02-19 DIAGNOSIS — Z122 Encounter for screening for malignant neoplasm of respiratory organs: Secondary | ICD-10-CM

## 2018-02-19 NOTE — Telephone Encounter (Signed)
Received a referral for initial lung cancer screening scan.  Contacted the patient's caregiver and guardian Reece Agar and obtained their smoking history, current smoker with 34.5 pkyr history   as well as answering questions related to screening process.  Patient denies signs of lung cancer such as weight loss or hemoptysis at this time.  Patient denies comorbidity that would prevent curative treatment if lung cancer were found.  Patient is scheduled for the Shared Decision Making Visit and CT scan on 03-13-18@1400 .

## 2018-02-21 ENCOUNTER — Other Ambulatory Visit: Payer: Self-pay | Admitting: Family Medicine

## 2018-02-21 DIAGNOSIS — Z1231 Encounter for screening mammogram for malignant neoplasm of breast: Secondary | ICD-10-CM

## 2018-03-10 ENCOUNTER — Ambulatory Visit
Admission: RE | Admit: 2018-03-10 | Discharge: 2018-03-10 | Disposition: A | Discharge status: Home / Self Care | Payer: Medicaid Other | Source: Ambulatory Visit | Attending: Family Medicine | Admitting: Family Medicine

## 2018-03-10 DIAGNOSIS — Z1231 Encounter for screening mammogram for malignant neoplasm of breast: Secondary | ICD-10-CM | POA: Diagnosis present

## 2018-03-13 ENCOUNTER — Ambulatory Visit
Admission: RE | Admit: 2018-03-13 | Discharge: 2018-03-13 | Disposition: A | Discharge status: Home / Self Care | Payer: Medicaid Other | Source: Ambulatory Visit | Attending: Nurse Practitioner | Admitting: Nurse Practitioner

## 2018-03-13 ENCOUNTER — Inpatient Hospital Stay: Payer: Medicaid Other | Attending: Nurse Practitioner | Admitting: Nurse Practitioner

## 2018-03-13 DIAGNOSIS — Z87891 Personal history of nicotine dependence: Secondary | ICD-10-CM | POA: Diagnosis not present

## 2018-03-13 DIAGNOSIS — Z122 Encounter for screening for malignant neoplasm of respiratory organs: Secondary | ICD-10-CM | POA: Insufficient documentation

## 2018-03-13 DIAGNOSIS — J439 Emphysema, unspecified: Secondary | ICD-10-CM | POA: Insufficient documentation

## 2018-03-13 DIAGNOSIS — F17210 Nicotine dependence, cigarettes, uncomplicated: Secondary | ICD-10-CM | POA: Diagnosis not present

## 2018-03-13 NOTE — Progress Notes (Signed)
In accordance with CMS guidelines, patient has met eligibility criteria including age, absence of signs or symptoms of lung cancer.  Social History   Tobacco Use  . Smoking status: Current Every Day Smoker    Packs/day: 0.75    Years: 46.00    Pack years: 34.50    Types: Cigarettes  . Smokeless tobacco: Never Used  Substance Use Topics  . Alcohol use: No  . Drug use: No      A shared decision-making session was conducted prior to the performance of CT scan. This includes one or more decision aids, includes benefits and harms of screening, follow-up diagnostic testing, over-diagnosis, false positive rate, and total radiation exposure.   Counseling on the importance of adherence to annual lung cancer LDCT screening, impact of co-morbidities, and ability or willingness to undergo diagnosis and treatment is imperative for compliance of the program.   Counseling on the importance of continued smoking cessation for former smokers; the importance of smoking cessation for current smokers, and information about tobacco cessation interventions have been given to patient including [place] [facility]uit [facility] and 1800 quit [place] programs.   Written order for lung cancer screening with LDCT has been given to the patient and any and all questions have been answered to the best of my abilities.    Yearly follow up will be coordinated by Shawn Perkins, Thoracic Navigator.   , DNP, AGNP-C [facility] [phone] (work cell) [phone] (office) 03/13/18 3:48 PM   

## 2018-03-14 ENCOUNTER — Encounter: Payer: Self-pay | Admitting: *Deleted

## 2018-03-17 ENCOUNTER — Encounter: Payer: Self-pay | Admitting: Urology

## 2018-03-17 ENCOUNTER — Ambulatory Visit (INDEPENDENT_AMBULATORY_CARE_PROVIDER_SITE_OTHER): Payer: Medicaid Other | Admitting: Urology

## 2018-03-17 VITALS — BP 114/78 | HR 69 | Ht 65.0 in | Wt 136.0 lb

## 2018-03-17 DIAGNOSIS — R350 Frequency of micturition: Secondary | ICD-10-CM | POA: Diagnosis not present

## 2018-03-17 LAB — URINALYSIS, COMPLETE
Bilirubin, UA: NEGATIVE
GLUCOSE, UA: NEGATIVE
Ketones, UA: NEGATIVE
NITRITE UA: NEGATIVE
PH UA: 7 (ref 5.0–7.5)
Protein, UA: NEGATIVE
RBC, UA: NEGATIVE
Specific Gravity, UA: 1.02 (ref 1.005–1.030)
UUROB: 2 mg/dL — AB (ref 0.2–1.0)

## 2018-03-17 LAB — MICROSCOPIC EXAMINATION: RBC, UA: NONE SEEN /hpf (ref 0–2)

## 2018-03-17 MED ORDER — LIDOCAINE HCL URETHRAL/MUCOSAL 2 % EX GEL
1.0000 "application " | Freq: Once | CUTANEOUS | Status: AC
  Administered 2018-03-17: 1 via URETHRAL

## 2018-03-17 NOTE — Progress Notes (Signed)
03/17/2018 10:00 AM   Jasmine [name] 09-21-1955 [id number]  Referring provider: Donnie Coffin, MD [address] [place], [place] [address]  Chief Complaint  Patient presents with  . Cysto    HPI: Was consulted to assess the patient's frequent urination. The story was difficult to ascertain. She voids every hour and possibly just as frequent at night. She does not leak or wear pads. On requestioning I could not tell if she has a good flow but she seems to be double voiding a small amount and she will push at the end of urination. I believe the symptoms have been present for approximately 1 month. She does smoke. She has had a stroke  Patient has a very frequent bladder. She had no microscopic hematuria. I will call the urine cultures positive. I will see her back in about 5 weeks on Myrbetriq 50 mg samples   Patient is now on oxybutynin.  In retrospect the Myrbetriq probably worked better.  Oxybutynin did not help.  I do remember now that 1 of her chief complaints is that she needs to urinate and then she has trouble initiating urination.  Sometimes history is difficult to ascertain.  I have not yet performed cystoscopy.  The patient will try Flomax 0.4 mg.  When I see her in 5 weeks I will perform cystoscopy.  Intra-vesicle because of symptoms need to be looked into.   Today Last culture was negative Today Flow and frequency unchanged on Flomax.  Clinically not infected  Cystoscopy: Sterile technique was utilized to perform flexible cystoscopy after consent.  Bladder mucosa and trigone were normal.  There was no carcinoma or cystitis.  Urethral axis was clear and normal  No prolapse on pelvic examination or stress incontinence   PMH: Past Medical History:  Diagnosis Date  . Hypertension     Surgical History: Past Surgical History:  Procedure Laterality Date  . BRAIN SURGERY    . CHOLECYSTECTOMY    . COLONOSCOPY WITH PROPOFOL N/A 05/21/2017   Procedure: COLONOSCOPY WITH PROPOFOL;  Surgeon: Lollie Sails, MD;  Location: [facility] ENDOSCOPY;  Service: Endoscopy;  Laterality: N/A;    Home Medications:  Allergies as of 03/17/2018   No Known Allergies     Medication List        Accurate as of 03/17/18 10:00 AM. Always use your most recent med list.          aspirin EC 81 MG tablet Take 81 mg by mouth daily.   atorvastatin 40 MG tablet Commonly known as:  LIPITOR Take 40 mg by mouth daily.   citalopram 20 MG tablet Commonly known as:  CELEXA Take 20 mg by mouth daily.   docusate sodium 100 MG capsule Commonly known as:  COLACE Take 100 mg by mouth 2 (two) times daily.   Fluticasone-Salmeterol 250-50 MCG/DOSE Aepb Commonly known as:  ADVAIR Inhale 1 puff into the lungs every 12 (twelve) hours.   NEURONTIN 300 MG capsule Generic drug:  gabapentin Take 600 mg by mouth 3 (three) times daily.   oxybutynin 10 MG 24 hr tablet Commonly known as:  DITROPAN-XL Take 1 tablet (10 mg total) by mouth daily.   polyethylene glycol packet Commonly known as:  MIRALAX / GLYCOLAX Take 17 g by mouth daily.   tamsulosin 0.4 MG Caps capsule Commonly known as:  FLOMAX Take 1 capsule (0.4 mg total) by mouth daily.   tazarotene 0.05 % cream Commonly known as:  TAZORAC Apply topically 2 (two)  times daily.   traZODone 50 MG tablet Commonly known as:  DESYREL Take 50 mg by mouth at bedtime.       Allergies: No Known Allergies  Family History: Family History  Problem Relation Age of Onset  . Breast cancer Neg Hx     Social History:  reports that she has been smoking cigarettes. She has a 34.50 pack-year smoking history. She has never used smokeless tobacco. She reports that she does not drink alcohol or use drugs.  ROS:                                        Physical Exam: There were no vitals taken for this visit.  Constitutional:  Alert and oriented, No acute distress.   Laboratory  Data: Lab Results  Component Value Date   WBC 8.6 11/06/2014   HGB 14.0 11/06/2014   HCT 43.2 11/06/2014   MCV 81.6 11/06/2014   PLT 151 11/06/2014    Lab Results  Component Value Date   CREATININE 0.80 11/06/2014    No results found for: PSA  No results found for: TESTOSTERONE  No results found for: HGBA1C  Urinalysis    Component Value Date/Time   COLORURINE YELLOW (A) 11/06/2014 1555   APPEARANCEUR Clear 02/10/2018 1455   LABSPEC 1.012 11/06/2014 1555   LABSPEC 1.010 09/23/2014 1146   PHURINE 7.0 11/06/2014 1555   GLUCOSEU Negative 02/10/2018 1455   GLUCOSEU Negative 09/23/2014 1146   HGBUR NEGATIVE 11/06/2014 1555   BILIRUBINUR Negative 02/10/2018 1455   BILIRUBINUR Negative 09/23/2014 1146   KETONESUR NEGATIVE 11/06/2014 1555   PROTEINUR Negative 02/10/2018 1455   PROTEINUR 30 (A) 11/06/2014 1555   NITRITE Negative 02/10/2018 1455   NITRITE NEGATIVE 11/06/2014 1555   LEUKOCYTESUR 1+ (A) 02/10/2018 1455   LEUKOCYTESUR Negative 09/23/2014 1146    Pertinent Imaging:   Assessment & Plan: The patient has not been helped with treatment of her frequency and flow symptoms.  I do not have other good options for her.  I will see her as needed  1. Urinary frequency  - Urinalysis, Complete   No follow-ups on file.  Reece Packer, MD  [facility] [address] [place], [place] [address] [phone]

## 2019-03-09 ENCOUNTER — Telehealth: Payer: Self-pay | Admitting: *Deleted

## 2019-03-09 DIAGNOSIS — Z87891 Personal history of nicotine dependence: Secondary | ICD-10-CM

## 2019-03-09 DIAGNOSIS — Z122 Encounter for screening for malignant neoplasm of respiratory organs: Secondary | ICD-10-CM

## 2019-03-09 NOTE — Telephone Encounter (Signed)
Patient has been notified that annual lung cancer screening low dose CT scan is due currently or will be in near future. Confirmed that patient is within the age range of 55-77, and asymptomatic, (no signs or symptoms of lung cancer). Patient denies illness that would prevent curative treatment for lung cancer if found. Verified smoking history, (current, 35.25 pack year). The shared decision making visit was done 03/13/18. Patient is agreeable for CT scan being scheduled.

## 2019-03-17 IMAGING — CT CT CHEST LUNG CANCER SCREENING LOW DOSE W/O CM
2 of 5 series · 15 of 40 positions shown, 18 images · non-contrast
Comparison: None.

CLINICAL DATA: 62-year-old female current smoker, with 35 pack-year
history of smoking, for initial lung cancer screening

EXAM:
CT CHEST WITHOUT CONTRAST LOW-DOSE FOR LUNG CANCER SCREENING
TECHNIQUE: Multidetector CT imaging of the chest was performed following the
standard protocol without IV contrast.

[Series 3: lung · axial · 0.51mm/px · z∈[-1149,-891]mm · 12 of 286 slices shown, 15 images (1 of 2)]
[im 14/286  mediastinal]
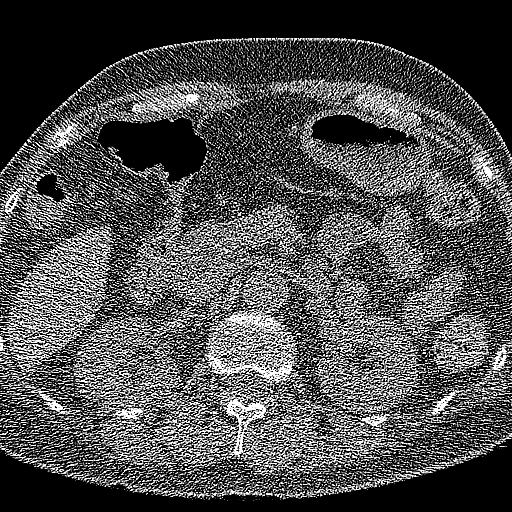
[im 14/286  lung]
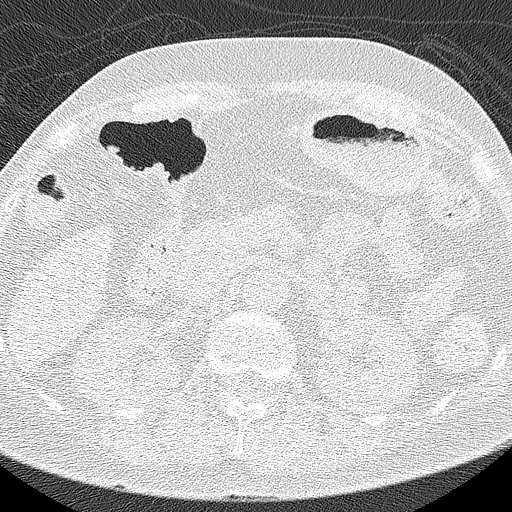
[im 41/286  lung]
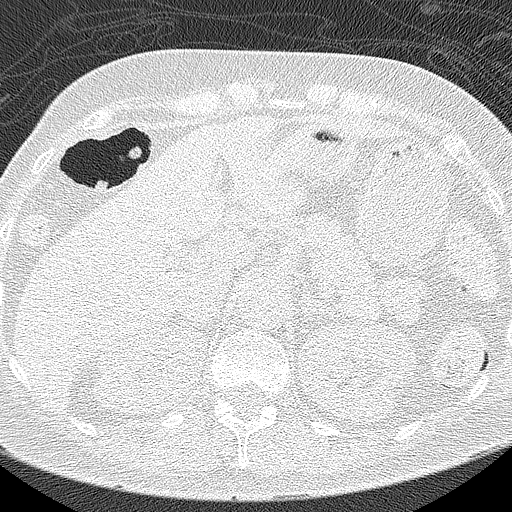
[im 68/286  lung]
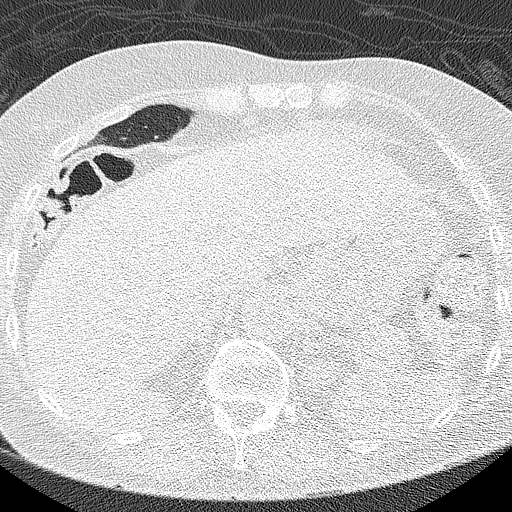
[im 82/286  lung]
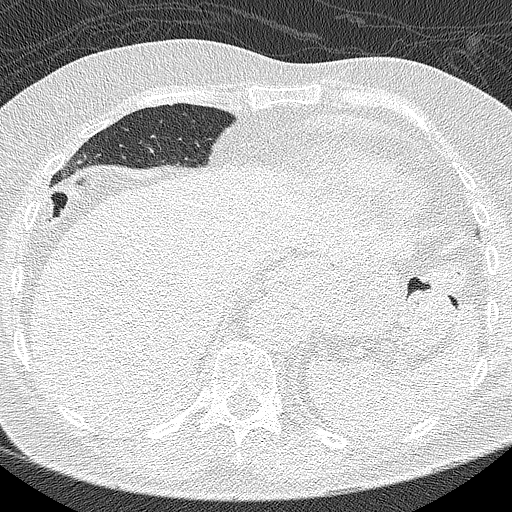
[im 109/286  mediastinal]
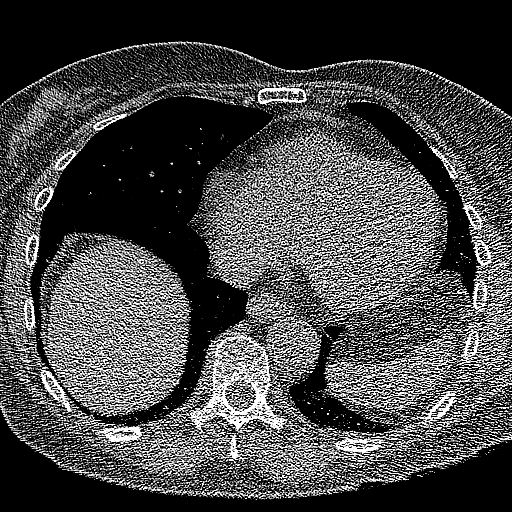
[im 109/286  lung]
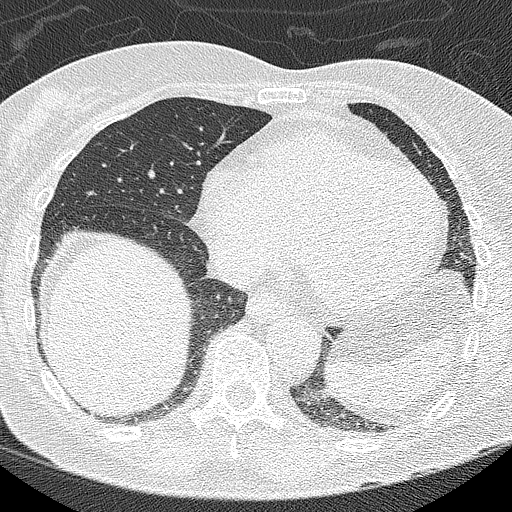
[im 136/286  lung]
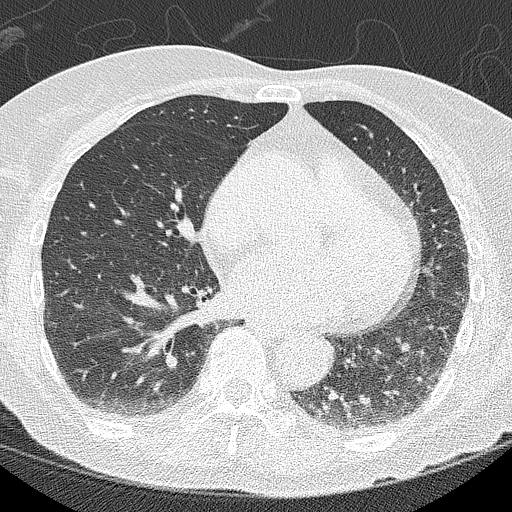
[im 150/286  lung]
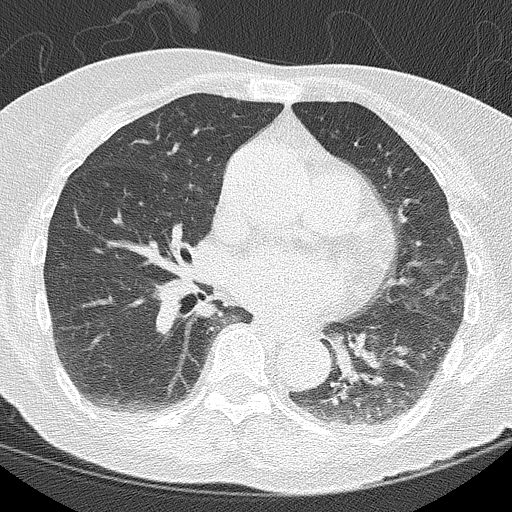
[im 177/286  lung]
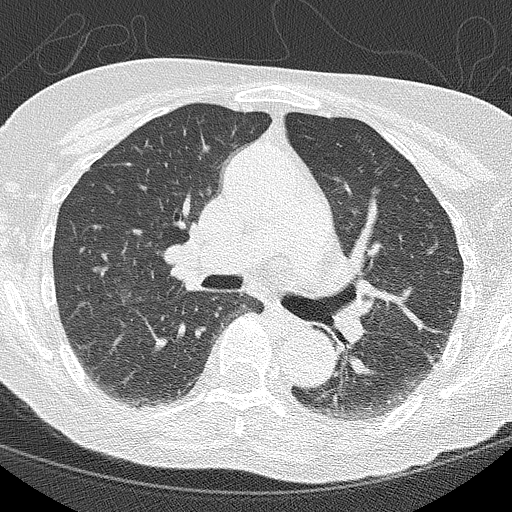
[im 204/286  mediastinal]
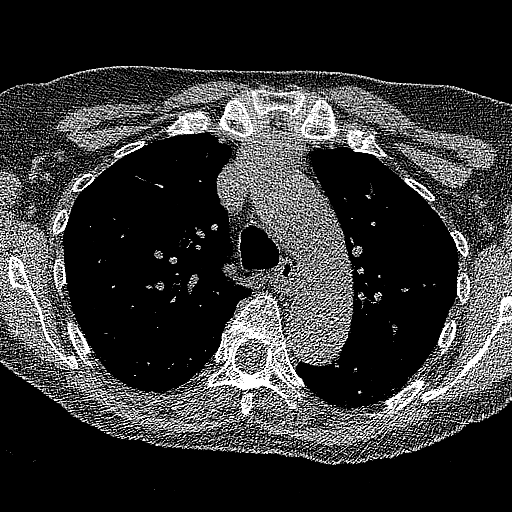
[im 204/286  lung]
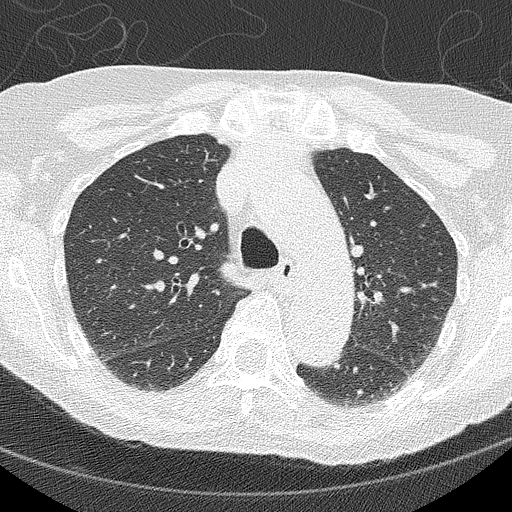
[im 218/286  lung]
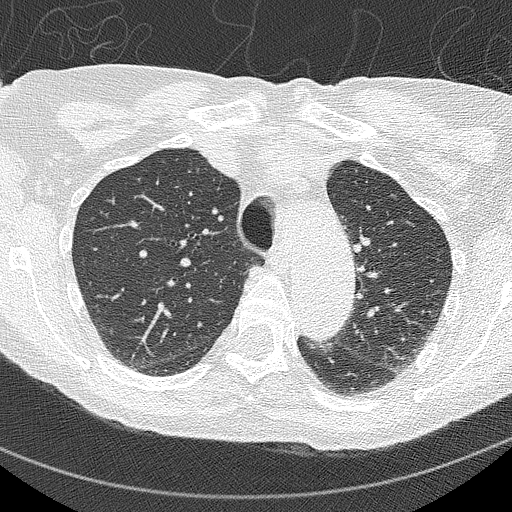
[im 245/286  lung]
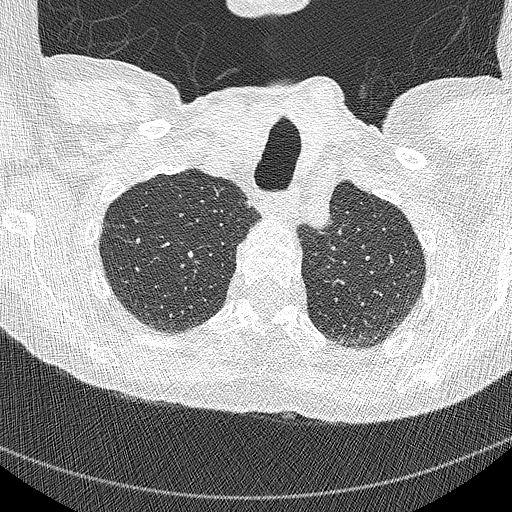
[im 272/286  lung]
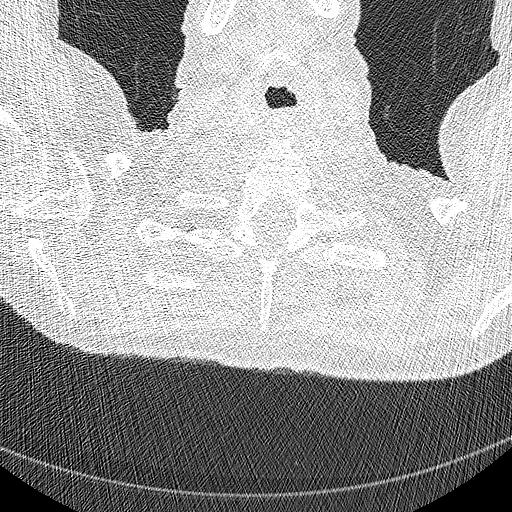

[Series 4: lung · coronal · 0.51mm/px · 3 of 237 slices shown (2 of 2)]
[im 48/237  lung]
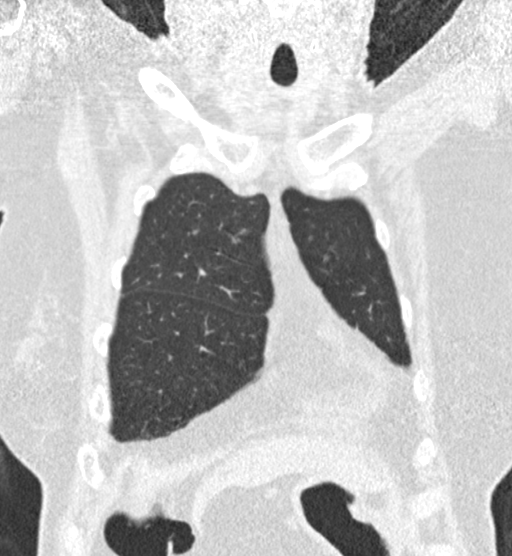
[im 95/237  lung]
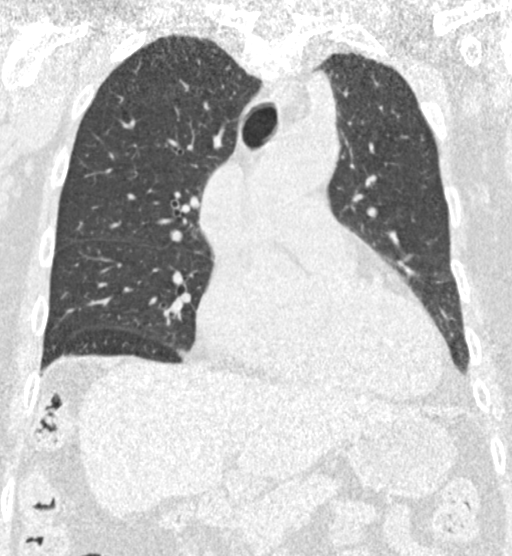
[im 142/237  lung]
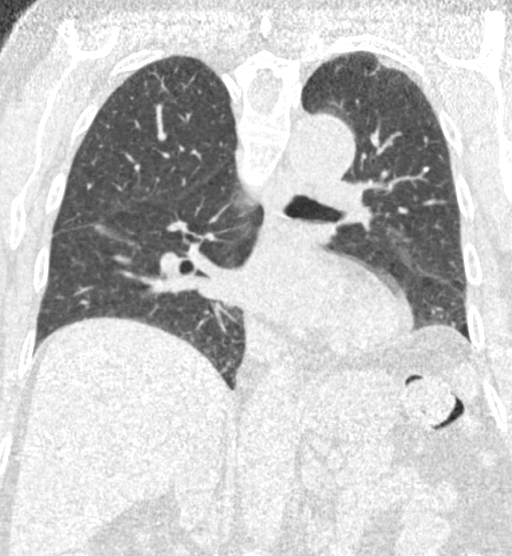

[15 of 40 positions shown; findings below may reference images not displayed]

FINDINGS: Cardiovascular: Heart is normal in size.  No pericardial effusion.

No evidence of thoracic aortic aneurysm.

Mild coronary atherosclerosis of the LAD.

Mediastinum/Nodes: No suspicious mediastinal lymphadenopathy.

Visualized thyroid is unremarkable.

Lungs/Pleura: Mild dependent atelectasis in the bilateral lower
lobes.

No focal consolidation.

Scattered bilateral pulmonary nodules, most of which are subpleural,
measuring up to 4.8 mm in the posterior right upper lobe and 4.7 mm
in the anterior right upper lobe.

Very mild centrilobular emphysematous changes.

No pleural effusion or pneumothorax.

Upper Abdomen: Visualized upper abdomen is notable for prior
cholecystectomy.

Musculoskeletal: Mild to moderate superior endplate compression
fracture deformity at T11.
IMPRESSION: Lung-RADS 2, benign appearance or behavior. Continue annual
screening with low-dose chest CT without contrast in 12 months.

Emphysema ([id number]).

## 2019-03-18 ENCOUNTER — Other Ambulatory Visit: Payer: Self-pay

## 2019-03-18 ENCOUNTER — Ambulatory Visit
Admission: RE | Admit: 2019-03-18 | Discharge: 2019-03-18 | Disposition: A | Discharge status: Home / Self Care | Payer: Medicaid Other | Source: Ambulatory Visit | Attending: Oncology | Admitting: Oncology

## 2019-03-18 DIAGNOSIS — Z87891 Personal history of nicotine dependence: Secondary | ICD-10-CM | POA: Insufficient documentation

## 2019-03-18 DIAGNOSIS — Z122 Encounter for screening for malignant neoplasm of respiratory organs: Secondary | ICD-10-CM | POA: Insufficient documentation

## 2019-03-23 ENCOUNTER — Telehealth: Payer: Self-pay | Admitting: *Deleted

## 2019-03-23 NOTE — Telephone Encounter (Signed)
Notified patient's caregiver of LDCT lung cancer screening program results with recommendation for 6 month follow up imaging. Also notified of incidental findings noted below and is encouraged to discuss further with PCP who will receive a copy of this note and/or the CT report. Patient's caregiver verbalizes understanding.   IMPRESSION: 1. Lung-RADS 3, probably benign findings. Short-term follow-up in 6 months is recommended with repeat low-dose chest CT without contrast (please use the following order, "CT CHEST LCS NODULE FOLLOW-UP W/O CM"). 2.  Emphysema. (ICD10-J43.9) 3.  Aortic Atherosclerois (ICD10-170.0) 4. Hypoattenuating liver lesions compatible with cavernous hemangiomas seen on previous abdomen CT of 11/07/2014.

## 2019-09-03 ENCOUNTER — Telehealth: Payer: Self-pay

## 2019-09-03 NOTE — Telephone Encounter (Signed)
Message left notifying patient that it is time to schedule the low dose lung cancer screening CT scan.  Instructed patient to return call to Shawn Perkins at [phone] to verify information prior to CT scan being scheduled.    

## 2019-09-17 ENCOUNTER — Telehealth: Payer: Self-pay

## 2019-09-17 NOTE — Telephone Encounter (Signed)
Patient's caregiver notified that it is time to schedule the low dose lung cancer screening CT scan.    Caregiver states that patient's daughter, Elvis Coil, would be the person to talk to regarding CT scan and she will be available tomorrow morning.per

## 2019-10-01 ENCOUNTER — Telehealth: Payer: Self-pay | Admitting: *Deleted

## 2019-10-01 DIAGNOSIS — R918 Other nonspecific abnormal finding of lung field: Secondary | ICD-10-CM

## 2019-10-01 DIAGNOSIS — Z87891 Personal history of nicotine dependence: Secondary | ICD-10-CM

## 2019-10-01 NOTE — Telephone Encounter (Signed)
(  10/01/19) Left message for pt to notify them that it is time to schedule annual low dose lung cancer screening CT scan. Instructed patient to call back to verify information prior to the scan being scheduled SRW

## 2019-10-13 NOTE — Telephone Encounter (Signed)
Spoke with Elvis Coil, patient's caregiver who will call me back when she is in the office.

## 2019-10-19 NOTE — Addendum Note (Signed)
Addended by: Lieutenant Diego on: 10/19/2019 10:39 AM   Modules accepted: Orders

## 2019-10-19 NOTE — Telephone Encounter (Signed)
Contacted and scheduled for LCS nodule follow up 

## 2019-10-21 ENCOUNTER — Other Ambulatory Visit: Payer: Self-pay | Admitting: *Deleted

## 2019-10-21 DIAGNOSIS — R918 Other nonspecific abnormal finding of lung field: Secondary | ICD-10-CM

## 2019-10-21 DIAGNOSIS — Z87891 Personal history of nicotine dependence: Secondary | ICD-10-CM

## 2019-10-23 NOTE — Telephone Encounter (Signed)
Patient's caregiver informed of low dose lung cancer screening CT scan appointment on 10/26/19 @ 9:30

## 2019-10-26 ENCOUNTER — Ambulatory Visit: Payer: Medicaid Other

## 2019-10-26 ENCOUNTER — Ambulatory Visit
Admission: RE | Admit: 2019-10-26 | Discharge: 2019-10-26 | Disposition: A | Discharge status: Home / Self Care | Payer: Medicaid Other | Source: Ambulatory Visit | Attending: Oncology | Admitting: Oncology

## 2019-10-26 ENCOUNTER — Other Ambulatory Visit: Payer: Self-pay

## 2019-10-26 DIAGNOSIS — R918 Other nonspecific abnormal finding of lung field: Secondary | ICD-10-CM | POA: Insufficient documentation

## 2019-10-26 DIAGNOSIS — Z87891 Personal history of nicotine dependence: Secondary | ICD-10-CM | POA: Diagnosis not present

## 2019-10-29 ENCOUNTER — Encounter: Payer: Self-pay | Admitting: *Deleted

## 2020-01-15 ENCOUNTER — Other Ambulatory Visit: Payer: Self-pay | Admitting: Family Medicine

## 2020-01-15 DIAGNOSIS — Z1231 Encounter for screening mammogram for malignant neoplasm of breast: Secondary | ICD-10-CM

## 2020-10-26 ENCOUNTER — Telehealth: Payer: Self-pay

## 2020-10-26 NOTE — Telephone Encounter (Signed)
Attempted to call patient for lung screening CT scan. She is no longer at the facility she was in which is the [phone] & [phone] number and the number listed as her mother, [phone] is no longer in service.

## 2020-11-15 ENCOUNTER — Other Ambulatory Visit: Payer: Self-pay

## 2020-11-15 ENCOUNTER — Emergency Department: Payer: Medicare Other

## 2020-11-15 ENCOUNTER — Inpatient Hospital Stay: Payer: Medicare Other

## 2020-11-15 ENCOUNTER — Inpatient Hospital Stay
Admission: EM | Admit: 2020-11-15 | Discharge: 2020-11-18 | DRG: 070 | Disposition: A | Discharge status: Home Health | Payer: Medicare Other | Attending: Internal Medicine | Admitting: Internal Medicine

## 2020-11-15 ENCOUNTER — Encounter: Payer: Self-pay | Admitting: Internal Medicine

## 2020-11-15 DIAGNOSIS — E860 Dehydration: Secondary | ICD-10-CM | POA: Diagnosis present

## 2020-11-15 DIAGNOSIS — E785 Hyperlipidemia, unspecified: Secondary | ICD-10-CM | POA: Diagnosis present

## 2020-11-15 DIAGNOSIS — K5900 Constipation, unspecified: Secondary | ICD-10-CM | POA: Diagnosis present

## 2020-11-15 DIAGNOSIS — Z9119 Patient's noncompliance with other medical treatment and regimen: Secondary | ICD-10-CM | POA: Diagnosis not present

## 2020-11-15 DIAGNOSIS — J440 Chronic obstructive pulmonary disease with acute lower respiratory infection: Secondary | ICD-10-CM | POA: Diagnosis present

## 2020-11-15 DIAGNOSIS — R531 Weakness: Secondary | ICD-10-CM | POA: Diagnosis present

## 2020-11-15 DIAGNOSIS — J45909 Unspecified asthma, uncomplicated: Secondary | ICD-10-CM

## 2020-11-15 DIAGNOSIS — G9340 Encephalopathy, unspecified: Secondary | ICD-10-CM

## 2020-11-15 DIAGNOSIS — Z28310 Unvaccinated for covid-19: Secondary | ICD-10-CM | POA: Diagnosis not present

## 2020-11-15 DIAGNOSIS — R1310 Dysphagia, unspecified: Secondary | ICD-10-CM | POA: Diagnosis present

## 2020-11-15 DIAGNOSIS — W19XXXA Unspecified fall, initial encounter: Secondary | ICD-10-CM | POA: Diagnosis present

## 2020-11-15 DIAGNOSIS — F17210 Nicotine dependence, cigarettes, uncomplicated: Secondary | ICD-10-CM | POA: Diagnosis present

## 2020-11-15 DIAGNOSIS — G9341 Metabolic encephalopathy: Principal | ICD-10-CM | POA: Diagnosis present

## 2020-11-15 DIAGNOSIS — F0390 Unspecified dementia without behavioral disturbance: Secondary | ICD-10-CM | POA: Diagnosis present

## 2020-11-15 DIAGNOSIS — I10 Essential (primary) hypertension: Secondary | ICD-10-CM | POA: Diagnosis present

## 2020-11-15 DIAGNOSIS — R4182 Altered mental status, unspecified: Secondary | ICD-10-CM

## 2020-11-15 DIAGNOSIS — Z79899 Other long term (current) drug therapy: Secondary | ICD-10-CM

## 2020-11-15 DIAGNOSIS — Z87820 Personal history of traumatic brain injury: Secondary | ICD-10-CM

## 2020-11-15 DIAGNOSIS — R930 Abnormal findings on diagnostic imaging of skull and head, not elsewhere classified: Secondary | ICD-10-CM

## 2020-11-15 DIAGNOSIS — Z7982 Long term (current) use of aspirin: Secondary | ICD-10-CM

## 2020-11-15 DIAGNOSIS — Z7189 Other specified counseling: Secondary | ICD-10-CM | POA: Diagnosis not present

## 2020-11-15 DIAGNOSIS — Z515 Encounter for palliative care: Secondary | ICD-10-CM

## 2020-11-15 DIAGNOSIS — R6520 Severe sepsis without septic shock: Secondary | ICD-10-CM | POA: Diagnosis not present

## 2020-11-15 DIAGNOSIS — J189 Pneumonia, unspecified organism: Secondary | ICD-10-CM | POA: Diagnosis present

## 2020-11-15 DIAGNOSIS — J690 Pneumonitis due to inhalation of food and vomit: Secondary | ICD-10-CM | POA: Diagnosis present

## 2020-11-15 DIAGNOSIS — Z20822 Contact with and (suspected) exposure to covid-19: Secondary | ICD-10-CM | POA: Diagnosis present

## 2020-11-15 DIAGNOSIS — Z9049 Acquired absence of other specified parts of digestive tract: Secondary | ICD-10-CM

## 2020-11-15 DIAGNOSIS — Z720 Tobacco use: Secondary | ICD-10-CM

## 2020-11-15 DIAGNOSIS — R9431 Abnormal electrocardiogram [ECG] [EKG]: Secondary | ICD-10-CM | POA: Diagnosis present

## 2020-11-15 DIAGNOSIS — A419 Sepsis, unspecified organism: Secondary | ICD-10-CM | POA: Diagnosis not present

## 2020-11-15 HISTORY — DX: Unspecified asthma, uncomplicated: J45.909

## 2020-11-15 LAB — CBC
HCT: 39 % (ref 36.0–46.0)
Hemoglobin: 13.5 g/dL (ref 12.0–15.0)
MCH: 26 pg (ref 26.0–34.0)
MCHC: 34.6 g/dL (ref 30.0–36.0)
MCV: 75.1 fL — ABNORMAL LOW (ref 80.0–100.0)
Platelets: 206 10*3/uL (ref 150–400)
RBC: 5.19 MIL/uL — ABNORMAL HIGH (ref 3.87–5.11)
RDW: 15.5 % (ref 11.5–15.5)
WBC: 8.6 10*3/uL (ref 4.0–10.5)
nRBC: 0 % (ref 0.0–0.2)

## 2020-11-15 LAB — URINALYSIS, COMPLETE (UACMP) WITH MICROSCOPIC
Bacteria, UA: NONE SEEN
Bilirubin Urine: NEGATIVE
Glucose, UA: NEGATIVE mg/dL
Hgb urine dipstick: NEGATIVE
Ketones, ur: NEGATIVE mg/dL
Leukocytes,Ua: NEGATIVE
Nitrite: NEGATIVE
Protein, ur: NEGATIVE mg/dL
Specific Gravity, Urine: 1.009 (ref 1.005–1.030)
Squamous Epithelial / HPF: NONE SEEN (ref 0–5)
pH: 7 (ref 5.0–8.0)

## 2020-11-15 LAB — RESP PANEL BY RT-PCR (FLU A&B, COVID) ARPGX2
Influenza A by PCR: NEGATIVE
Influenza B by PCR: NEGATIVE
SARS Coronavirus 2 by RT PCR: NEGATIVE

## 2020-11-15 LAB — COMPREHENSIVE METABOLIC PANEL
ALT: 10 U/L (ref 0–44)
AST: 18 U/L (ref 15–41)
Albumin: 3.8 g/dL (ref 3.5–5.0)
Alkaline Phosphatase: 80 U/L (ref 38–126)
Anion gap: 4 — ABNORMAL LOW (ref 5–15)
BUN: 6 mg/dL — ABNORMAL LOW (ref 8–23)
CO2: 27 mmol/L (ref 22–32)
Calcium: 10.2 mg/dL (ref 8.9–10.3)
Chloride: 107 mmol/L (ref 98–111)
Creatinine, Ser: 0.66 mg/dL (ref 0.44–1.00)
GFR, Estimated: >60 mL/min (ref >60)
Glucose, Bld: 106 mg/dL — ABNORMAL HIGH (ref 70–99)
Potassium: 3.8 mmol/L (ref 3.5–5.1)
Sodium: 138 mmol/L (ref 135–145)
Total Bilirubin: 1 mg/dL (ref 0.3–1.2)
Total Protein: 8 g/dL (ref 6.5–8.1)

## 2020-11-15 LAB — BLOOD GAS, VENOUS
Acid-Base Excess: 2.1 mmol/L — ABNORMAL HIGH (ref 0.0–2.0)
Bicarbonate: 25.5 mmol/L (ref 20.0–28.0)
O2 Saturation: 83 %
Patient temperature: 37
pCO2, Ven: 35 mmHg — ABNORMAL LOW (ref 44.0–60.0)
pH, Ven: 7.47 — ABNORMAL HIGH (ref 7.250–7.430)
pO2, Ven: 44 mmHg (ref 32.0–45.0)

## 2020-11-15 LAB — AMMONIA: Ammonia: 20 umol/L (ref 9–35)

## 2020-11-15 LAB — PHOSPHORUS: Phosphorus: 1.6 mg/dL — ABNORMAL LOW (ref 2.5–4.6)

## 2020-11-15 LAB — PROTIME-INR
INR: 1 (ref 0.8–1.2)
Prothrombin Time: 13.3 seconds (ref 11.4–15.2)

## 2020-11-15 LAB — CK: Total CK: 29 U/L — ABNORMAL LOW (ref 38–234)

## 2020-11-15 LAB — LACTIC ACID, PLASMA
Lactic Acid, Venous: 1.9 mmol/L (ref 0.5–1.9)
Lactic Acid, Venous: 1.2 mmol/L (ref 0.5–1.9)

## 2020-11-15 LAB — MAGNESIUM: Magnesium: 1.9 mg/dL (ref 1.7–2.4)

## 2020-11-15 LAB — STREP PNEUMONIAE URINARY ANTIGEN: Strep Pneumo Urinary Antigen: NEGATIVE

## 2020-11-15 MED ORDER — ACETAMINOPHEN 650 MG RE SUPP
650.0000 mg | Freq: Four times a day (QID) | RECTAL | Status: DC | PRN
Start: 2020-11-15 — End: 2020-11-18

## 2020-11-15 MED ORDER — MOMETASONE FURO-FORMOTEROL FUM 200-5 MCG/ACT IN AERO
2.0000 | INHALATION_SPRAY | Freq: Two times a day (BID) | RESPIRATORY_TRACT | Status: DC
  Administered 2020-11-16 – 2020-11-18 (×4): 2 via RESPIRATORY_TRACT
  Filled 2020-11-15 (×2): qty 8.8

## 2020-11-15 MED ORDER — ALBUTEROL SULFATE (2.5 MG/3ML) 0.083% IN NEBU: 2.5000 mg | INHALATION_SOLUTION | RESPIRATORY_TRACT | Status: DC | PRN

## 2020-11-15 MED ORDER — SODIUM CHLORIDE 0.9 % IV SOLN: 500.0000 mg | INTRAVENOUS | Status: DC

## 2020-11-15 MED ORDER — ACETAMINOPHEN 650 MG RE SUPP
650.0000 mg | Freq: Once | RECTAL | Status: AC
  Administered 2020-11-15: 650 mg via RECTAL
  Filled 2020-11-15: qty 1

## 2020-11-15 MED ORDER — SODIUM CHLORIDE 0.9 % IV SOLN
1.0000 g | Freq: Once | INTRAVENOUS | Status: AC
  Administered 2020-11-15: 1 g via INTRAVENOUS
  Filled 2020-11-15: qty 10

## 2020-11-15 MED ORDER — LACTATED RINGERS IV SOLN: INTRAVENOUS | Status: AC

## 2020-11-15 MED ORDER — SODIUM CHLORIDE 0.9 % IV SOLN
75.0000 mL/h | INTRAVENOUS | Status: DC
  Administered 2020-11-15: 75 mL/h via INTRAVENOUS

## 2020-11-15 MED ORDER — SODIUM CHLORIDE 0.9 % IV SOLN
500.0000 mg | Freq: Once | INTRAVENOUS | Status: AC
  Administered 2020-11-15: 500 mg via INTRAVENOUS
  Filled 2020-11-15: qty 500

## 2020-11-15 MED ORDER — LACTATED RINGERS IV BOLUS (SEPSIS)
1000.0000 mL | Freq: Once | INTRAVENOUS | Status: AC
  Administered 2020-11-15: 1000 mL via INTRAVENOUS

## 2020-11-15 MED ORDER — ACETAMINOPHEN 325 MG PO TABS
650.0000 mg | ORAL_TABLET | Freq: Four times a day (QID) | ORAL | Status: DC | PRN
  Administered 2020-11-17: 650 mg via ORAL
  Filled 2020-11-15: qty 2

## 2020-11-15 MED ORDER — ATORVASTATIN CALCIUM 20 MG PO TABS
40.0000 mg | ORAL_TABLET | Freq: Every day | ORAL | Status: DC
  Administered 2020-11-16 – 2020-11-18 (×3): 40 mg via ORAL
  Filled 2020-11-15 (×3): qty 2

## 2020-11-15 MED ORDER — SODIUM CHLORIDE 0.9 % IV SOLN: 2.0000 g | INTRAVENOUS | Status: DC

## 2020-11-15 NOTE — ED Notes (Signed)
Patient taken to CT.

## 2020-11-15 NOTE — Sepsis Progress Note (Signed)
Monitoring for code sepsis protocol. 

## 2020-11-15 NOTE — ED Triage Notes (Signed)
EMS brought in from home for increased weakness over the past few days. Increased confusion. Unsteady gait reported by EMS. Respirations even and unlabored. Per EMS family poor historian. Patient presented to ER saturated in urine.   Oral temp 102.5 by EMS.

## 2020-11-15 NOTE — ED Provider Notes (Signed)
[facility] Emergency Department Provider Note    ____________________________________________   I have reviewed the triage vital signs and the nursing notes.   HISTORY  Chief Complaint Weakness and Altered Mental Status   History limited by: AMS. History obtained from daughter at bedside.   HPI Jasmine [name] is a 65 y.o. female who presents to the emergency department today because of concern for weakness and AMS. Daughter states that she noticed today that the patient was not acting like herself. She was repeating things that she normally would not. Additionally the daughter noticed the patient was a lot weaker than normal. The patient did have a fall and was not able to get up off the ground. Even when daughter went to help her off the ground the patient could not use her cane which she would normally be able to do. The daughter had not noticed any fevers. Members of the household had been sick a couple of weeks ago.     Records reviewed. Per medical record review patient has a history of TBI, HTN.   Past Medical History:  Diagnosis Date   Hypertension     There are no problems to display for this patient.   Past Surgical History:  Procedure Laterality Date   BRAIN SURGERY     CHOLECYSTECTOMY     COLONOSCOPY WITH PROPOFOL N/A 05/21/2017   Procedure: COLONOSCOPY WITH PROPOFOL;  Surgeon: Lollie Sails, MD;  Location: [facility] ENDOSCOPY;  Service: Endoscopy;  Laterality: N/A;    Prior to Admission medications   Medication Sig Start Date End Date Taking? Authorizing Provider  aspirin EC 81 MG tablet Take 81 mg by mouth daily.    [provider]  atorvastatin (LIPITOR) 40 MG tablet Take 40 mg by mouth daily.    [provider]  citalopram (CELEXA) 20 MG tablet Take 20 mg by mouth daily.    [provider]  docusate sodium (COLACE) 100 MG capsule Take 100 mg by mouth 2 (two) times daily.    [provider]   Fluticasone-Salmeterol (ADVAIR) 250-50 MCG/DOSE AEPB Inhale 1 puff into the lungs every 12 (twelve) hours.    [provider]  gabapentin (NEURONTIN) 300 MG capsule Take 600 mg by mouth 3 (three) times daily.    [provider]  oxybutynin (DITROPAN-XL) 10 MG 24 hr tablet Take 1 tablet (10 mg total) by mouth daily. 11/29/17   MacDiarmid, Nicki Reaper, MD  polyethylene glycol (MIRALAX / GLYCOLAX) packet Take 17 g by mouth daily.    [provider]  tamsulosin (FLOMAX) 0.4 MG CAPS capsule Take 1 capsule (0.4 mg total) by mouth daily. 02/10/18   MacDiarmid, Nicki Reaper, MD  tazarotene (TAZORAC) 0.05 % cream Apply topically 2 (two) times daily.    [provider]  traZODone (DESYREL) 50 MG tablet Take 50 mg by mouth at bedtime.    [provider]    Allergies Patient has no known allergies.  Family History  Problem Relation Age of Onset   Breast cancer Neg Hx     Social History Social History   Tobacco Use   Smoking status: Every Day    Packs/day: 0.75    Years: 46.00    Pack years: 34.50    Types: Cigarettes   Smokeless tobacco: Never  Substance Use Topics   Alcohol use: No   Drug use: No    Review of Systems Unable to obtain reliable ROS secondary to medical condition.   ____________________________________________   PHYSICAL  EXAM:  VITAL SIGNS: ED Triage Vitals  Enc Vitals Group     BP 11/15/20 1927 131/87     Pulse Rate 11/15/20 1915 (!) 114     Resp 11/15/20 1915 20     Temp 11/15/20 1925 (!) 102.5 F (39.2 C)     Temp Source 11/15/20 1925 Axillary     SpO2 11/15/20 1907 95 %     Weight 11/15/20 1928 112 lb (50.8 kg)     Height 11/15/20 1928 5\' 5"  (1.651 m)    Constitutional: Awake and alert.  Eyes: Conjunctivae are normal.  ENT      Head: s/p brain surgery.       Nose: No congestion/rhinnorhea.      Mouth/Throat: Mucous membranes are moist.      Neck: No stridor. Hematological/Lymphatic/Immunilogical: No cervical  lymphadenopathy. Cardiovascular: Tachycardic, regular rhythm.  No murmurs, rubs, or gallops.  Respiratory: Normal respiratory effort without tachypnea nor retractions. Breath sounds are clear and equal bilaterally. No wheezes/rales/rhonchi. Gastrointestinal: Soft and non tender. No rebound. No guarding.  Genitourinary: Deferred Musculoskeletal: Normal range of motion in all extremities. No lower extremity edema. Neurologic:  Awake and alert. Not oriented.  Skin:  Skin is warm, dry and intact. No rash noted. Psychiatric: Mood and affect are normal. Speech and behavior are normal. Patient exhibits appropriate insight and judgment.  ____________________________________________    LABS (pertinent positives/negatives)  UA clear, not consistent with infection CMP wnl except glu 106, BUN 6 CBC wbc 8.6, hgb 13.5, plt 206 COVID negative ____________________________________________   EKG  I, Nance Pear, attending physician, personally viewed and interpreted this EKG  EKG Time: 1917 Rate: 110 Rhythm: sinus tachycardia Axis: normal Intervals: qtc 704 QRS: narrow ST changes: no st elevation Impression: abnormal ekg  ____________________________________________    RADIOLOGY  CXR Atelectasis left lung base, question early changes of pneumonia  ____________________________________________   PROCEDURES  Procedures  ____________________________________________   INITIAL IMPRESSION / ASSESSMENT AND PLAN / ED COURSE  Pertinent labs & imaging results that were available during my care of the patient were reviewed by me and considered in my medical decision making (see chart for details).   Patient presented to the emergency department today because of concerns for increased weakness and altered mental status.  Patient was found to be febrile tachycardic on initial vital signs.  Patient was also tachypneic.  Did raise concern for possible infectious source.  Urine without any  concerning findings.  Chest x-ray however is somewhat concerning for pneumonia and this could explain some of the patient's tachypnea.  Will start on antibiotics.  Will plan on admission.  ____________________________________________   FINAL CLINICAL IMPRESSION(S) / ED DIAGNOSES  Final diagnoses:  Weakness  Altered mental status, unspecified altered mental status type  Pneumonia due to infectious organism, unspecified laterality, unspecified part of lung     Note: This dictation was prepared with Dragon dictation. Any transcriptional errors that result from this process are unintentional     Nance Pear, MD 11/15/20 2125

## 2020-11-15 NOTE — H&P (Addendum)
Has been   Jasmine [name] ZES:[id number] DOB: Mar 31, 1956 DOA: 11/15/2020     PCP: Orvis Brill, [facility]   Outpatient Specialists:   NONE    Patient arrived to ER on 11/15/20 at 1904 Referred by Attending Nance Pear, MD   Patient coming from: home  With family    Chief Complaint:   Chief Complaint  Patient presents with   Weakness   Altered Mental Status    HPI: Jasmine [name] is a 65 y.o. female with medical history significant of hyperlipidemia, TBI    Presented with   fatigue and altered mental status.  History given by daughter.  Not acting like himself repeating sentences.  Generalized fatigue had a fall and could not get up Patient and daughter did not notice fever but have had other family member sick for the past few weeks  Baseline able to walk with a cane  Infectious risk factors:  Reports  fever, shortness of breath,      Has  NOt been vaccinated against COVID    Initial COVID TEST  NEGATIVE   Lab Results  Component Value Date   [place] NEGATIVE 11/15/2020     Regarding pertinent Chronic problems:    Hyperlipidemia -  on statins lipitor Lipid Panel  No results found for: CHOL, TRIG, HDL, CHOLHDL, VLDL, LDLCALC, LDLDIRECT, LABVLDL      COPD - not  followed by pulmonology  not  on baseline oxygen  *L,         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     While in ER:  Noted to be tachycardic and febrile up to 102.5  UA with no evidence of UTI normal white blood cell count COVID-negative Chest x-ray worrisome for pneumonia Noted to be tachypneic Start on antibiotics ED Triage Vitals  Enc Vitals Group     BP 11/15/20 1927 131/87     Pulse Rate 11/15/20 1915 (!) 114     Resp 11/15/20 1915 20     Temp 11/15/20 1925 (!) 102.5 F (39.2 C)     Temp Source 11/15/20 1925 Axillary     SpO2 11/15/20 1907 95 %     Weight 11/15/20 1928 112 lb (50.8 kg)     Height 11/15/20 1928 '5\' 5"'  (1.651 m)     Head Circumference --      Peak Flow --      Pain  Score --      Pain Loc --      Pain Edu? --      Excl. in [place]? --   TMAX(24)@     _________________________________________ Significant initial  Findings: Abnormal Labs Reviewed  COMPREHENSIVE METABOLIC PANEL - Abnormal; Notable for the following components:      Result Value   Glucose, Bld 106 (*)    BUN 6 (*)    Anion gap 4 (*)    All other components within normal limits  CBC - Abnormal; Notable for the following components:   RBC 5.19 (*)    MCV 75.1 (*)    All other components within normal limits  URINALYSIS, COMPLETE (UACMP) WITH MICROSCOPIC - Abnormal; Notable for the following components:   Color, Urine YELLOW (*)    APPearance CLEAR (*)    All other components within normal limits   ____________________________________________ Ordered CT HEAD Postsurgical changes from a right frontotemporoparietal craniectomy. Age indeterminate hypoattenuation in the left pons. If there is concern for acute process, consider MR imaging for further characterization  CXR -left  base atelectasis possible pneumonia     ECG: Ordered Personally reviewed by me showing: HR : 110 Rhythm:  Sinus tachycardia    no evidence of ischemic changes QTC7 00? ____________________ This patient meets SIRS Criteria    The recent clinical data is shown below. Vitals:   11/15/20 1930 11/15/20 1933 11/15/20 2000 11/15/20 2030  BP: 126/81  128/85 123/78  Pulse: 100  97 94  Resp: (!) 22  (!) 26 (!) 23  Temp:  (!) 103.2 F (39.6 C)    TempSrc:  Oral    SpO2: 95%  94% 95%  Weight:      Height:         WBC     Component Value Date/Time   WBC 8.6 11/15/2020 1926   LYMPHSABS 1.4 11/06/2014 1554   LYMPHSABS 1.7 09/23/2014 1043   MONOABS 0.5 11/06/2014 1554   MONOABS 0.5 09/23/2014 1043   EOSABS 0.1 11/06/2014 1554   EOSABS 0.1 09/23/2014 1043   BASOSABS 0.4 (H) 11/06/2014 1554   BASOSABS 0.0 09/23/2014 1043     Lactic Acid, Venous    Component Value Date/Time   LATICACIDVEN 1.9  11/15/2020 1926     Procalcitonin  Ordered     UA  no evidence of UTI      Urine analysis:    Component Value Date/Time   COLORURINE YELLOW (A) 11/15/2020 1943   APPEARANCEUR CLEAR (A) 11/15/2020 1943   APPEARANCEUR Clear 03/17/2018 0921   LABSPEC 1.009 11/15/2020 1943   LABSPEC 1.010 09/23/2014 1146   PHURINE 7.0 11/15/2020 1943   GLUCOSEU NEGATIVE 11/15/2020 1943   GLUCOSEU Negative 09/23/2014 [place] 11/15/2020 [place] NEGATIVE 11/15/2020 1943   BILIRUBINUR Negative 03/17/2018 0921   BILIRUBINUR Negative 09/23/2014 1146   KETONESUR NEGATIVE 11/15/2020 1943   PROTEINUR NEGATIVE 11/15/2020 1943   NITRITE NEGATIVE 11/15/2020 1943   LEUKOCYTESUR NEGATIVE 11/15/2020 1943   LEUKOCYTESUR Negative 09/23/2014 1146    Results for orders placed or performed during the hospital encounter of 11/15/20  Resp Panel by RT-PCR (Flu A&B, Covid) Nasopharyngeal Swab     Status: None   Collection Time: 11/15/20  7:43 PM   Specimen: Nasopharyngeal Swab; Nasopharyngeal(NP) swabs in vial transport medium  Result Value Ref Range Status   SARS Coronavirus 2 by RT PCR NEGATIVE NEGATIVE Final         Influenza A by PCR NEGATIVE NEGATIVE Final   Influenza B by PCR NEGATIVE NEGATIVE Final           _______________________________________________ Hospitalist was called for admission for sepsis due to CAP  The following Work up has been ordered so far:  Orders Placed This Encounter  Procedures   Culture, blood (Routine x 2)   Resp Panel by RT-PCR (Flu A&B, Covid) Nasopharyngeal Swab   DG Chest Port 1 View   Comprehensive metabolic panel   CBC   Protime-INR - (order if patient is taking Coumadin / Warfarin)   Lactic acid, plasma   Urinalysis, Complete w Microscopic   Diet NPO time specified   Document Height and Actual Weight   Neuro checks q 2 hours x12 hours   Cardiac monitoring   Notify Physician if pt is possible Sepsis patient   DO NOT delay antibiotics  if unable to obtain blood culture.   Consult to hospitalist   Code Sepsis activation.  This occurs automatically when order is signed and prioritizes pharmacy, lab, and radiology services for STAT collections and interventions.  If [facility] downtime, call [facility] [phone]) to activate Code Sepsis.   pharmacy consult   Airborne and Contact precautions   Pulse oximetry, continuous   CBG monitoring, ED   EKG 12-Lead   Insert 2nd peripheral IV if not already present.     Following Medications were ordered in ER: Medications  cefTRIAXone (ROCEPHIN) 1 g in sodium chloride 0.9 % 100 mL IVPB (1 g Intravenous New Bag/Given 11/15/20 2122)  azithromycin (ZITHROMAX) 500 mg in sodium chloride 0.9 % 250 mL IVPB (has no administration in time range)  lactated ringers infusion (has no administration in time range)  lactated ringers bolus 1,000 mL (has no administration in time range)  acetaminophen (TYLENOL) suppository 650 mg (650 mg Rectal Given 11/15/20 1943)        Consult Orders  (From admission, onward)           Start     Ordered   11/15/20 2113  Consult to hospitalist  Once       Provider:  (Not yet assigned)  Question Answer Comment  Place call to: hospitalist   Reason for Consult Admit   Diagnosis/Clinical Info for Consult: pna      11/15/20 2112              OTHER Significant initial  Findings:  labs showing:    Recent Labs  Lab 11/15/20 1926  NA 138  K 3.8  CO2 27  GLUCOSE 106*  BUN 6*  CREATININE 0.66  CALCIUM 10.2    Cr    stable,    Lab Results  Component Value Date   CREATININE 0.66 11/15/2020   CREATININE 0.80 11/06/2014   CREATININE 0.61 09/23/2014    Recent Labs  Lab 11/15/20 1926  AST 18  ALT 10  ALKPHOS 80  BILITOT 1.0  PROT 8.0  ALBUMIN 3.8   Lab Results  Component Value Date   CALCIUM 10.2 11/15/2020   PHOS 1.6 (L) 11/15/2020           Plt: Lab Results  Component Value Date   PLT 206 11/15/2020       COVID-19  Labs  No results for input(s): DDIMER, FERRITIN, LDH, CRP in the last 72 hours.  Lab Results  Component Value Date   SARSCOV2NAA NEGATIVE 11/15/2020    Venous  Blood Gas result:  pH 7.47 pCO2 35       Recent Labs  Lab 11/15/20 1926  WBC 8.6  HGB 13.5  HCT 39.0  MCV 75.1*  PLT 206    HG/HCT  stable,      Component Value Date/Time   HGB 13.5 11/15/2020 1926   HGB 13.9 09/23/2014 1043   HCT 39.0 11/15/2020 1926   HCT 39.8 09/23/2014 1043   MCV 75.1 (L) 11/15/2020 1926   MCV 78 (L) 09/23/2014 1043      No results for input(s): LIPASE, AMYLASE in the last 168 hours. Recent Labs  Lab 11/15/20 2145  AMMONIA 20     Cardiac Panel (last 3 results) Recent Labs    11/15/20 2145  CKTOTAL 60*         Cultures: No results found for: SDES, SPECREQUEST, CULT, REPTSTATUS   Radiological Exams on Admission: DG Abd 1 View  Result Date: 11/15/2020 CLINICAL DATA:  Weakness, confusion, constipation EXAM: ABDOMEN - 1 VIEW COMPARISON:  CT 10/07/2014 FINDINGS: No high-grade obstructive bowel gas pattern. Moderate colonic stool burden. Numerous phleboliths in the pelvis. Surgical clips in the right upper quadrant. No suspicious  abdominal calcifications. Catch catheter overlies the pelvis. Telemetry leads overlie the upper abdomen. Degenerative changes in the spine, hips and pelvis. IMPRESSION: No high-grade obstruction.  Moderate stool burden. Electronically Signed   By: Lovena Le M.D.   On: 11/15/2020 23:11   CT Head Wo Contrast  Result Date: 11/15/2020 CLINICAL DATA:  Altered mental status, increasing confusion EXAM: CT HEAD WITHOUT CONTRAST TECHNIQUE: Contiguous axial images were obtained from the base of the skull through the vertex without intravenous contrast. COMPARISON:  None. FINDINGS: Brain: Postsurgical changes from broad craniectomy with a mildly sunken appearance of the dural flap, indeterminate particularly in the absence of comparison. Some overlying dural  calcifications are noted. Regions of encephalomalacia seen in the right frontal, temporal and parietal lobe, insula and basal ganglia. More age indeterminate left left pontine hypoattenuation (4/12). Remote lacunar type infarcts are seen in the right caudate and the right cerebellar hemisphere. No other acute or suspicious intracranial abnormality. No hyperdense hemorrhage or extra-axial collection. No significant midline shift. Basal cisterns are patent. Background of patchy microvascular changes. Vascular: Atherosclerotic calcification of the carotid siphons. No hyperdense vessel. Skull: Broad right frontotemporoparietal craniectomy. No other acute or conspicuous osseous abnormalities. Sinuses/Orbits: Paranasal sinuses and mastoid air cells are predominantly clear. Debris in the bilateral external auditory canals. Included orbital structures are unremarkable. Other: None IMPRESSION: 1. Postsurgical changes from a right frontotemporoparietal craniectomy. Extensive subjacent remote appearing encephalomalacia in the right frontal, temporal, parietal lobes, insula and basal ganglia. Slightly depressed appearance of the dural flap is nonspecific bleeding, possibly a chronic finding in this patient though comparison imaging is unavailable at the time of exam. 2. Age indeterminate hypoattenuation in the left pons. If there is concern for acute process, consider MR imaging for further characterization. 3. Punctate foci likely reflecting remote lacunar infarcts in the right caudate and cerebellum. 4. Intracranial atherosclerosis and patchy microvascular angiopathy. 5. Debris in the bilateral external auditory canals, correlate for cerumen impaction. Electronically Signed   By: Lovena Le M.D.   On: 11/15/2020 22:27   DG Chest Port 1 View  Result Date: 11/15/2020 CLINICAL DATA:  Confusion and weakness EXAM: PORTABLE CHEST 1 VIEW COMPARISON:  June 27, 2015 FINDINGS: There is atelectatic change in the left base.  Elsewhere interstitium is somewhat thickened. Heart size and pulmonary vascularity normal. No adenopathy. Postoperative change in right humerus noted. IMPRESSION: Atelectasis left base. Earliest changes of pneumonia this area questioned. Elsewhere there is interstitial thickening likely represents a degree of chronic bronchitis. The heart size is normal. No evident adenopathy. Electronically Signed   By: Lowella Grip III M.D.   On: 11/15/2020 20:07   _______________________________________________________________________________________________________ Latest  Blood pressure 123/78, pulse 94, temperature (!) 103.2 F (39.6 C), temperature source Oral, resp. rate (!) 23, height '5\' 5"'  (1.651 m), weight 50.8 kg, SpO2 95 %.   Review of Systems:    Pertinent positives include:  fatigue,  Constitutional:  No weight loss, night sweats, Fevers, chills, weight loss  HEENT:  No headaches, Difficulty swallowing,Tooth/dental problems,Sore throat,  No sneezing, itching, ear ache, nasal congestion, post nasal drip,  Cardio-vascular:  No chest pain, Orthopnea, PND, anasarca, dizziness, palpitations.no Bilateral lower extremity swelling  GI:  No heartburn, indigestion, abdominal pain, nausea, vomiting, diarrhea, change in bowel habits, loss of appetite, melena, blood in stool, hematemesis Resp:  no shortness of breath at rest. No dyspnea on exertion, No excess mucus, no productive cough, No non-productive cough, No coughing up of blood.No change in color of mucus.No wheezing. Skin:  no rash or lesions. No jaundice GU:  no dysuria, change in color of urine, no urgency or frequency. No straining to urinate.  No flank pain.  Musculoskeletal:  No joint pain or no joint swelling. No decreased range of motion. No back pain.  Psych:  No change in mood or affect. No depression or anxiety. No memory loss.  Neuro: no localizing neurological complaints, no tingling, no weakness, no double vision, no gait  abnormality, no slurred speech, no confusion  All systems reviewed and apart from [place] all are negative _______________________________________________________________________________________________ Past Medical History:   Past Medical History:  Diagnosis Date   Hypertension       Past Surgical History:  Procedure Laterality Date   BRAIN SURGERY     CHOLECYSTECTOMY     COLONOSCOPY WITH PROPOFOL N/A 05/21/2017   Procedure: COLONOSCOPY WITH PROPOFOL;  Surgeon: Lollie Sails, MD;  Location[facility] ENDOSCOPY;  Service: Endoscopy;  Laterality: N/A;    Social History:  Ambulatory  cane,       reports that she has been smoking cigarettes. She has a 34.50 pack-year smoking history. She has never used smokeless tobacco. She reports that she does not drink alcohol and does not use drugs.    Family History:   Family History  Problem Relation Age of Onset   Breast cancer Neg Hx    ______________________________________________________________________________________________ Allergies: No Known Allergies   Prior to Admission medications   Medication Sig Start Date End Date Taking? Authorizing Provider  aspirin EC 81 MG tablet Take 81 mg by mouth daily.    [provider]  atorvastatin (LIPITOR) 40 MG tablet Take 40 mg by mouth daily.    [provider]  citalopram (CELEXA) 20 MG tablet Take 20 mg by mouth daily.    [provider]  docusate sodium (COLACE) 100 MG capsule Take 100 mg by mouth 2 (two) times daily.    [provider]  Fluticasone-Salmeterol (ADVAIR) 250-50 MCG/DOSE AEPB Inhale 1 puff into the lungs every 12 (twelve) hours.    [provider]  gabapentin (NEURONTIN) 300 MG capsule Take 600 mg by mouth 3 (three) times daily.    [provider]  oxybutynin (DITROPAN-XL) 10 MG 24 hr tablet Take 1 tablet (10 mg total) by mouth daily. 11/29/17   MacDiarmid, Nicki Reaper, MD  polyethylene glycol (MIRALAX / GLYCOLAX) packet  Take 17 g by mouth daily.    [provider]  tamsulosin (FLOMAX) 0.4 MG CAPS capsule Take 1 capsule (0.4 mg total) by mouth daily. 02/10/18   MacDiarmid, Nicki Reaper, MD  tazarotene (TAZORAC) 0.05 % cream Apply topically 2 (two) times daily.    [provider]  traZODone (DESYREL) 50 MG tablet Take 50 mg by mouth at bedtime.    [provider]    ___________________________________________________________________________________________________ Physical Exam: Vitals with BMI 11/15/2020 11/15/2020 11/15/2020  Height - - -  Weight - - -  BMI - - -  Systolic 220 254 270  Diastolic 78 85 81  Pulse 94 97 100     1. General:  in No  Acute distress   Chronically ill  -appearing 2. Psychological: Alert and  Oriented 3. Head/ENT:    Dry Mucous Membranes                          Head  traumatic, sp right craniotomy  neck supple  Poor Dentition 4. SKIN: decreased Skin turgor,  Skin clean Dry and intact no rash 5. Heart: Regular rate and rhythm no Murmur, no Rub or gallop 6. Lungs:   no wheezes or crackles   7. Abdomen: Soft,  non-tender, Non distended  bowel sounds present 8. Lower extremities: no clubbing, cyanosis, no  edema 9. Neurologically strength diminished on right side pt states that is chronic 10. MSK: Normal range of motion    Chart has been reviewed  ______________________________________________________________________________________________  Assessment/Plan 65 y.o. female with medical history significant of hyperlipidemia, TBI Admitted for  CAP acute encephalopathy  Present on Admission:  Sepsis ([place]) -  -SIRS criteria met with    tachycardia   ,  fever  RR >20 Today's Vitals   11/15/20 1930 11/15/20 1933 11/15/20 2000 11/15/20 2030  BP: 126/81  128/85 123/78  Pulse: 100  97 94  Resp: (!) 22  (!) 26 (!) 23  Temp:  (!) 103.2 F (39.6 C)    TempSrc:  Oral    SpO2: 95%  94% 95%  Weight:      Height:          -Most  likely source being:  pulmonary, I      Patient meeting criteria for Severe sepsis with    evidence of end organ damage/organ dysfunction such as    acute metabolic encephalopathy     - Obtain serial lactic acid and procalcitonin level.  - Initiated IV antibiotics rocephin and doxy given QT prolongation  - await results of blood and urine culture  - Rehydrate aggressively      9:55 PM   CAP (community acquired pneumonia) -  - will admit for treatment of CAP will start on appropriate antibiotic coverage. Rocephin 11/15/2020 and doxycycline 11/16/20    Obtain:  sputum cultures,                  Obtain respiratory panel and influenza serologies                  blood cultures                   strep pneumo UA antigen,    Eval for aspiration                Provide oxygen as needed.    Acute metabolic encephalopathy -   - most likely multifactorial secondary to combination of  infection   mild dehydration secondary to decreased by mouth intake,   polypharmacy   - Will rehydrate   - treat underlining infection   - Hold contributing medications   -  CT showing possible abnormality around pons will order MRI Pt symptoms onset >12h ago  - VBG unremarkable no evidence of hypercarbia    - no history of liver disease ammonia unremarkable   Essential hypertension - allow permissive HTN   Asthma - chronic albuterol PRN   QT prolongation - - will monitor on tele avoid QT prolonging medications, rehydrate correct electrolytes  Hypophosphatemia - will replace  Constipation order bowel regimen    tobacco abuse -  - Spoke about importance of quitting spent 5 minutes discussing options for treatment, prior attempts at quitting, and dangers of smoking  -At this point patient is   NOT  interested in quitting  - order nicotine patch   - nursing tobacco cessation protocol   Other plan as per orders.  DVT prophylaxis:  SCD     Code Status:  Code Status: Not on file FULL CODE  care as  per   family  I had personally discussed CODE STATUS with patient      Family Communication:   Family not at  Bedside    Disposition Plan:        To home once workup is complete and patient is stable   Following barriers for discharge:                                                             Afebrile,   able to transition to PO antibiotics                             Will need to be able to tolerate PO                            Will likely need home health,                                                    Would benefit from PT/OT eval prior to DC  Ordered                                 Consults called: please call neurology if abnomal MRI   Admission status:  ED Disposition     ED Disposition  [place]: [place] [[id number]]  Level of Care: Progressive Cardiac [[id number]]  Admit to Progressive based on following criteria: MULTISYSTEM THREATS such as stable sepsis, metabolic/electrolyte imbalance with or without encephalopathy that is responding to early treatment.  Covid Evaluation: Confirmed COVID Negative  Diagnosis: Sepsis [facility]) [[id number]]  Admitting Physician: Toy Baker [[id number]]  Attending Physician: Toy Baker [[id number]]  Estimated length of stay: past midnight tomorrow  Certification:: I certify this patient will need inpatient services for at least 2 midnights            inpatient     I Expect 2 midnight stay secondary to severity of patient's current illness need for inpatient interventions justified by the following:   hemodynamic instability despite optimal treatment (tachycardia  )  Severe lab/radiological/exam abnormalities including:    CAP and extensive comorbidities including:    COPD/asthma   dementia    That are currently affecting medical management.   I expect  patient to be hospitalized for 2 midnights requiring inpatient medical care.  Patient is at high risk for  adverse outcome (such as loss of life or disability) if not treated.  Indication for inpatient stay as follows:  Severe change from baseline regarding mental status    Need for IV antibiotics, IV fluids,        Level of care   progressive tele indefinitely please discontinue once patient no longer qualifies COVID-19 Labs    Lab Results  Component Value Date   [place] NEGATIVE 11/15/2020     Precautions: admitted as Covid Negative  PPE: Used by the provider:   N95  eye Goggles,  Gloves     Toy Baker 11/16/2020, 12:24 AM    [facility]     after 2 AM please page floor coverage PA If 7AM-7PM, please contact the day team taking care of the patient using [email or web address]   Patient was evaluated in the context of the global COVID-19 pandemic, which necessitated consideration that the patient might be at risk for infection with the SARS-CoV-2 virus that causes COVID-19. Institutional protocols and algorithms that pertain to the evaluation of patients at risk for COVID-19 are in a state of rapid change based on information released by regulatory bodies including the CDC and federal and state organizations. These policies and algorithms were followed during the patient's care.

## 2020-11-15 NOTE — Consult Note (Signed)
CODE SEPSIS - PHARMACY COMMUNICATION  **Broad Spectrum Antibiotics should be administered within 1 hour of Sepsis diagnosis**  Time Code Sepsis Called/Page Received: 2126  Antibiotics Ordered: 2110  Time of 1st antibiotic administration: 2122  Additional action taken by pharmacy: N/A  If necessary, Name of Provider/Nurse Contacted: N/A    Darnelle Bos ,PharmD Clinical Pharmacist  11/15/2020  9:41 PM

## 2020-11-16 ENCOUNTER — Inpatient Hospital Stay: Payer: Medicare Other

## 2020-11-16 DIAGNOSIS — J690 Pneumonitis due to inhalation of food and vomit: Secondary | ICD-10-CM

## 2020-11-16 DIAGNOSIS — R9431 Abnormal electrocardiogram [ECG] [EKG]: Secondary | ICD-10-CM | POA: Diagnosis present

## 2020-11-16 DIAGNOSIS — K5900 Constipation, unspecified: Secondary | ICD-10-CM | POA: Diagnosis present

## 2020-11-16 LAB — HIV ANTIBODY (ROUTINE TESTING W REFLEX): HIV Screen 4th Generation wRfx: NONREACTIVE

## 2020-11-16 LAB — TSH: TSH: 0.941 u[IU]/mL (ref 0.350–4.500)

## 2020-11-16 LAB — COMPREHENSIVE METABOLIC PANEL
ALT: 10 U/L (ref 0–44)
AST: 19 U/L (ref 15–41)
Albumin: 3 g/dL — ABNORMAL LOW (ref 3.5–5.0)
Alkaline Phosphatase: 63 U/L (ref 38–126)
Anion gap: 9 (ref 5–15)
BUN: 6 mg/dL — ABNORMAL LOW (ref 8–23)
CO2: 25 mmol/L (ref 22–32)
Calcium: 9.3 mg/dL (ref 8.9–10.3)
Chloride: 107 mmol/L (ref 98–111)
Creatinine, Ser: 0.74 mg/dL (ref 0.44–1.00)
GFR, Estimated: >60 mL/min (ref >60)
Glucose, Bld: 109 mg/dL — ABNORMAL HIGH (ref 70–99)
Potassium: 3.9 mmol/L (ref 3.5–5.1)
Sodium: 141 mmol/L (ref 135–145)
Total Bilirubin: 0.9 mg/dL (ref 0.3–1.2)
Total Protein: 6.9 g/dL (ref 6.5–8.1)

## 2020-11-16 LAB — CBC WITH DIFFERENTIAL/PLATELET
Abs Immature Granulocytes: 0.07 10*3/uL (ref 0.00–0.07)
Basophils Absolute: 0 10*3/uL (ref 0.0–0.1)
Basophils Relative: 0 %
Eosinophils Absolute: 0 10*3/uL (ref 0.0–0.5)
Eosinophils Relative: 0 %
HCT: 33.3 % — ABNORMAL LOW (ref 36.0–46.0)
Hemoglobin: 12.1 g/dL (ref 12.0–15.0)
Immature Granulocytes: 1 %
Lymphocytes Relative: 14 %
Lymphs Abs: 1.8 10*3/uL (ref 0.7–4.0)
MCH: 26.5 pg (ref 26.0–34.0)
MCHC: 36.3 g/dL — ABNORMAL HIGH (ref 30.0–36.0)
MCV: 72.9 fL — ABNORMAL LOW (ref 80.0–100.0)
Monocytes Absolute: 1 10*3/uL (ref 0.1–1.0)
Monocytes Relative: 8 %
Neutro Abs: 10.1 10*3/uL — ABNORMAL HIGH (ref 1.7–7.7)
Neutrophils Relative %: 77 %
Platelets: 194 10*3/uL (ref 150–400)
RBC: 4.57 MIL/uL (ref 3.87–5.11)
RDW: 15.4 % (ref 11.5–15.5)
WBC: 12.9 10*3/uL — ABNORMAL HIGH (ref 4.0–10.5)
nRBC: 0 % (ref 0.0–0.2)

## 2020-11-16 LAB — LIPID PANEL
Cholesterol: 155 mg/dL (ref 0–200)
HDL: 42 mg/dL (ref >40)
LDL Cholesterol: 106 mg/dL — ABNORMAL HIGH (ref 0–99)
Total CHOL/HDL Ratio: 3.7 RATIO
Triglycerides: 36 mg/dL (ref <150)
VLDL: 7 mg/dL (ref 0–40)

## 2020-11-16 LAB — PROCALCITONIN
Procalcitonin: <0.1 ng/mL
Procalcitonin: 0.31 ng/mL

## 2020-11-16 LAB — PHOSPHORUS: Phosphorus: 3.1 mg/dL (ref 2.5–4.6)

## 2020-11-16 LAB — MAGNESIUM: Magnesium: 1.7 mg/dL (ref 1.7–2.4)

## 2020-11-16 MED ORDER — SODIUM PHOSPHATES 45 MMOLE/15ML IV SOLN
10.0000 mmol | Freq: Once | INTRAVENOUS | Status: AC
  Administered 2020-11-16: 10 mmol via INTRAVENOUS
  Filled 2020-11-16: qty 3.33

## 2020-11-16 MED ORDER — STROKE: EARLY STAGES OF RECOVERY BOOK: Freq: Once | Status: DC

## 2020-11-16 MED ORDER — SENNOSIDES-DOCUSATE SODIUM 8.6-50 MG PO TABS
1.0000 | ORAL_TABLET | Freq: Every day | ORAL | Status: DC
  Administered 2020-11-16 – 2020-11-17 (×2): 1 via ORAL
  Filled 2020-11-16 (×2): qty 1

## 2020-11-16 MED ORDER — ENOXAPARIN SODIUM 40 MG/0.4ML IJ SOSY
40.0000 mg | PREFILLED_SYRINGE | INTRAMUSCULAR | Status: DC
  Filled 2020-11-16 (×2): qty 0.4

## 2020-11-16 MED ORDER — POLYETHYLENE GLYCOL 3350 17 G PO PACK
17.0000 g | PACK | Freq: Two times a day (BID) | ORAL | Status: DC
  Administered 2020-11-16 – 2020-11-18 (×4): 17 g via ORAL
  Filled 2020-11-16 (×4): qty 1

## 2020-11-16 MED ORDER — TRAZODONE HCL 50 MG PO TABS
50.0000 mg | ORAL_TABLET | Freq: Every day | ORAL | Status: DC
  Administered 2020-11-16: 50 mg via ORAL
  Filled 2020-11-16: qty 1

## 2020-11-16 MED ORDER — SODIUM CHLORIDE 0.9 % IV SOLN
3.0000 g | Freq: Four times a day (QID) | INTRAVENOUS | Status: DC
  Administered 2020-11-16 – 2020-11-17 (×3): 3 g via INTRAVENOUS
  Filled 2020-11-16: qty 3
  Filled 2020-11-16 (×2): qty 8
  Filled 2020-11-16: qty 3
  Filled 2020-11-16: qty 8
  Filled 2020-11-16: qty 3
  Filled 2020-11-16 (×2): qty 8

## 2020-11-16 MED ORDER — SODIUM CHLORIDE 0.9 % IV SOLN
100.0000 mg | Freq: Two times a day (BID) | INTRAVENOUS | Status: DC
  Administered 2020-11-16: 100 mg via INTRAVENOUS
  Filled 2020-11-16 (×3): qty 100

## 2020-11-16 NOTE — Evaluation (Addendum)
Clinical/Bedside Swallow Evaluation Patient Details  Name: Jasmine [name] MRN: [id number] Date of Birth: [birth year]  Today's Date: 11/16/2020 Time: SLP Start Time (ACUTE ONLY): 52 SLP Stop Time (ACUTE ONLY): 1010 SLP Time Calculation (min) (ACUTE ONLY): 50 min  Past Medical History:  Past Medical History:  Diagnosis Date   Asthma 11/15/2020   Hypertension    Past Surgical History:  Past Surgical History:  Procedure Laterality Date   BRAIN SURGERY     CHOLECYSTECTOMY     COLONOSCOPY WITH PROPOFOL N/A 05/21/2017   Procedure: COLONOSCOPY WITH PROPOFOL;  Surgeon: Lollie Sails, MD;  Location: [facility] ENDOSCOPY;  Service: Endoscopy;  Laterality: N/A;   HPI:  Pt is a 65 y.o. female with medical history significant of hyperlipidemia, TBI, " BRAIN SURGERY", CVA, dysphagia per chart notes and tobacco use.  Admitted for CAP, acute encephalopathy.  Recent illness in family per report.  CXR: "Atelectasis left base. Earliest changes of pneumonia this area  questioned. Elsewhere there is interstitial thickening likely  represents a degree of chronic bronchitis.". Pt has a h/o Dysphagia per objective assessment completed in 04/2020 per chart notes.   Assessment / Plan / Recommendation Clinical Impression  Pt appears to present w/ a somewhat functional oropharyngeal phase swallow overtly, however, she has a h/o dysphagia per chart notes. And, she only accepted a few po trials at this assessment today. Per chart, pt has had some degree of dysphagia since 2014-2015; 2021: MBSS revealed - "IMPRESSION:  1. Aspiration with thin liquids. 2. Markedly delayed oral phase on all consistencies. 3. Flash penetration with nectar thick liquids.  Pt remained on a "Soft diet w/ thin liquids" post that assessment per chart notes.  CXR results this admit reveal a "degree of chronic bronchitis". Suspect this could be related to aspiration(?).  Pt also has a h/o TBI and stroke -- suspect a degree of Cognitive decline  Baseline. Pt resided in a [place] previously per chart notes.  ANY Cognitive decline and/or Baseline Dysphagia increases risk for aspiration of po's. Pt is also Edentulous; this impacts effective mastication of solids.     Pt's Dtr stated pt only eats minimal amounts at home but enjoys chocolate ice cream and candy. She gives her Ensure drinks often during the day.  At this exam. pt consumed few po trials w/ No immediate, overt clinical s/s of aspiration during po trials minus phlegmy vocal quality post eating ice cream -- this cleared w/ prompt throat clearing/cough(strong cough and throat clear noted).  During po trials, pt consumed consistencies w/ no immediate, overt coughing, decline in respiratory presentation during/post trials. Oral phase appeared adequate for bolus management and control of bolus propulsion for A-P transfer for swallowing. Min-mod increased oral phase time as pt manipulated ice cream/puree boluses b/f swallowing. This was also noted w/ increased textured trials. Timely A-P transfer and clearing noted w/ thin liquids VIA CUP. Oral clearing achieved w/ all trial consistencies given time. OM Exam appeared [facility] w/ no unilateral weakness noted. Speech Clear just muttered at times. Pt fed self w/ setup support.   Recommend a (Dys. 2) MINCED foods consistency diet w/ gravies and moistened foods; Thin liquids VIA CUP for best control. Recommend general aspiration precautions, previously recommended strategies to include Chin Tuck and Throat clear/Cough during and after oral intake(to clear throat). Pills WHOLE vs CRUSHED in Puree for safer, easier swallowing d/t suspected Cognitive decline and Baseline Dysphagia. Monitoring during meals for follow through w/ precautions/strategies. Education given on Pills in Puree;  food consistencies and easy to eat options; general aspiration precautions to pt/NSG. ST to f/u w/ further oral intake and discussion re: pt's Baseline dysphagia, POC. NSG  agreed. Recommend discussion w/ MD and Palliative Care d/t Baseline Dysphagia and risk for Pulmonary decline in order to establish GOC moving forward, especially re: oral diet. SLP Visit Diagnosis: Dysphagia, oropharyngeal phase (R13.12) (Cognitive decline)    Aspiration Risk  Mild aspiration risk;Moderate aspiration risk;Risk for inadequate nutrition/hydration    Diet Recommendation  Dys. 2) MINCED foods consistency diet w/ gravies and moistened foods; Thin liquids VIA CUP for best control. Recommend general aspiration precautions, strategies to include Chin Tuck and Throat clear/Cough during and after oral intake(to clear throat). Monitoring during meals for follow through.   Medication Administration: Crushed with puree (vs Whole if able)    Other  Recommendations Recommended Consults:  (Dietician; Palliative Care for [place]) Oral Care Recommendations: Oral care BID;Oral care before and after PO;Staff/trained caregiver to provide oral care   Follow up Recommendations  (TBD)      Frequency and Duration min 3x week  2 weeks       Prognosis Prognosis for Safe Diet Advancement: Fair Barriers to Reach Goals: Cognitive deficits;Time post onset;Severity of deficits;Behavior;Motivation Barriers/Prognosis Comment: baseline dysphagia; baseline Cognitive decline suspected      Swallow Study   General Date of Onset: 11/15/20 HPI: Pt is a 65 y.o. female with medical history significant of hyperlipidemia, TBI, " BRAIN SURGERY", CVA, dysphagia per chart notes and tobacco use.  Admitted for CAP, acute encephalopathy.  Recent illness in family per report.  CXR: "Atelectasis left base. Earliest changes of pneumonia this area  questioned. Elsewhere there is interstitial thickening likely  represents a degree of chronic bronchitis.". Type of Study: Bedside Swallow Evaluation Previous Swallow Assessment: 2014-2015; 2021: MBSS revealed - "IMPRESSION:  1. Aspiration with thin liquids.   2. Markedly delayed  oral phase on all consistencies.   3. Flash penetration with nectar thick liquids.  Pt remained on a "Soft diet w/ thin liquids" post that assessment per chart notes. Diet Prior to this Study: Dysphagia 3 (soft);Thin liquids (per chart in 2021) Temperature Spikes Noted: No (wbc 12.9) Respiratory Status: Room air History of Recent Intubation: No Behavior/Cognition: Alert;Cooperative;Confused;Distractible;Requires cueing Oral Cavity Assessment: Within Functional Limits (minimal assessment d/t pt's participation) Oral Care Completed by SLP: Recent completion by staff Oral Cavity - Dentition: Edentulous (says she has dentures at home??) Vision: Functional for self-feeding Self-Feeding Abilities: Able to feed self;Needs assist;Needs set up Patient Positioning: Upright in bed (needed positioning support) Baseline Vocal Quality: Normal (at rest) Volitional Cough: Strong Volitional Swallow: Able to elicit    Oral/Motor/Sensory Function Overall Oral Motor/Sensory Function: Within functional limits (appropriate for bolus management)   Ice Chips Ice chips: Within functional limits Presentation: Spoon (fed; 1)   Thin Liquid Thin Liquid: Within functional limits Presentation: Cup;Self Fed (7 sips accepted; then 4 ozs of ice cream) Other Comments: min phlegmy quality after bites of ice cream x3 -- pt throat cleared/coughed and cleared appropriately w/ clear vocal quality afterwards.    Nectar Thick Nectar Thick Liquid: Not tested   Honey Thick Honey Thick Liquid: Not tested   Puree Puree: Within functional limits Presentation: Self Fed;Spoon (2 trials accepted)   Solid     Solid: Impaired (edentulous) Presentation: Spoon;Self Fed (2 trials) Oral Phase Impairments: Impaired mastication (edentulous) Pharyngeal Phase Impairments:  (none)         Orinda Kenner, MS, [employer] [facility]  [phone] Zachary Nole 11/16/2020,11:12 AM

## 2020-11-16 NOTE — Progress Notes (Signed)
Pharmacy Antibiotic Note  Jasmine [name] is a 65 y.o. female admitted on 11/15/2020 with possible  aspiration pneumonia .  Pharmacy has been consulted for Unasyn dosing.  -history of chronic dysphagia in the setting of traumatic brain injury and craniotomy  Plan:  Patient received Azithromycin/Ceftriaxone x 1 dose each on 6/14 in ED and Doxycycline x 1 dose in ED on 6/15  Will order Unasyn 3 gm IV q6h    Height: 5\' 5"  (165.1 cm) Weight: 50.8 kg (112 lb) IBW/kg (Calculated) : 57  Temp (24hrs), Avg:101.4 F (38.6 C), Min:99.3 F (37.4 C), Max:103.2 F (39.6 C)  Recent Labs  Lab 11/15/20 1926 11/15/20 2127 11/16/20 0445 11/16/20 0620  WBC 8.6  --  12.9*  --   CREATININE 0.66  --   --  0.74  LATICACIDVEN 1.9 1.2  --   --     Estimated Creatinine Clearance: 56.2 mL/min (by C-G formula based on SCr of 0.74 mg/dL).    No Known Allergies  Antimicrobials this admission: Azith/CTX x 1 dose each 6/14  Doxy x 1 dose  6/15  Unasyn 6/15>>  Dose adjustments this admission:    Microbiology results: 6/14 BCx: NG<12hrs  UCx:      Sputum:      MRSA PCR:    Thank you for allowing pharmacy to be a part of this patient's care.  Jasmine [name] A 11/16/2020 2:01 PM

## 2020-11-16 NOTE — ED Notes (Signed)
Pt ambulated with PT, SpO2 high 90s

## 2020-11-16 NOTE — Evaluation (Signed)
Occupational Therapy Evaluation Patient Details Name: Jasmine [name] MRN: [id number] DOB: September 01, 1955 Today's Date: 11/16/2020    History of Present Illness 65 y.o. female with medical history significant of hyperlipidemia, TBI        Presented with   fatigue and altered mental status.  Recent generalized fatigue, admitted with sepsis.   Clinical Impression   Pt seen for OT evaluation this date in setting of acute hospitalization d/t sepsis. Pt reports she is INDEP for self care at baseline and uses [facility] for fxl mobility. States that she lives with her dtr (who works) and while her BR/BA is upstairs, she has been sleeping on the couch and using the downstairs half bathroom since falling down the stairs recently (doesn't specify how recently). Pt presents this date with general weakness and decreased fxl activity tolerance. She requires gentle encouragement to participate with OT this date. Pt requires SUPV/CGA for sup<>sit and demos G static sitting balance. Pt declines to get OOB with OT assistance at this time citing fatigue. Pt requires SETUP for seated UB ADLs and MOD A for LB ADLs (mostly d/t c/o pain/discomfort when she voids, which RN is notified). Pt returned to bed with all needs met and in reach. OT educates re: importance of regular OOB activity to maintain strength and prevent skin breakdown  (chart review noted that she was found saturated in urine by EMS). Pt with moderate reception of education. Will continue to follow. Anticipate she will benefit from [facility] f/u to ensure safety with self care and better establish ADL routines.     Follow Up Recommendations  Home health OT;Supervision - Intermittent    Equipment Recommendations  3 in 1 bedside commode;Tub/shower seat    Recommendations for Other Services       Precautions / Restrictions Precautions Precautions: Fall Restrictions Weight Bearing Restrictions: No      Mobility Bed Mobility Overal bed mobility: Needs  Assistance Bed Mobility: Rolling;Supine to Sit;Sit to Supine Rolling: Supervision;Min guard   Supine to sit: Supervision;Min guard Sit to supine: Supervision;Min guard   General bed mobility comments: increased time, declines to get OOB/perform fxl mobility with OT    Transfers                 General transfer comment: CGA per PT note, pt declines to amb with OT on assessment.    Balance Overall balance assessment: Mild deficits observed, not formally tested                                         ADL either performed or assessed with clinical judgement   ADL Overall ADL's : Needs assistance/impaired                                       General ADL Comments: requires SETUP for UB ADLs, MOD A for LB ADLs, mostly (pt states) d/t discomfort in vaginal region 2/2 pain/burning when voiding.     Vision Patient Visual Report: No change from baseline       Perception     Praxis      Pertinent Vitals/Pain Pain Assessment: Faces Faces Pain Scale: Hurts little more Pain Location: c/o pain/discomfort with urinating, RN notified (urinalysis was apparently negative per RN Pain Descriptors / Indicators: Burning;Discomfort Pain Intervention(s): Limited activity within patient's  tolerance;Monitored during session     Hand Dominance     Extremity/Trunk Assessment Upper Extremity Assessment Upper Extremity Assessment: Generalized weakness (R UE slightly weaker, pt reports this is baseline for her)   Lower Extremity Assessment Lower Extremity Assessment: Generalized weakness       Communication Communication Communication: No difficulties   Cognition Arousal/Alertness: Awake/alert (drwosy/fatigued throughout, requires stimuli to rouse) Behavior During Therapy: [facility] for tasks assessed/performed;Restless Overall Cognitive Status: Within Functional Limits for tasks assessed                                 General Comments:  h/o TBI, craniotomy. Pt oriented x4, able to follow simple commands. Some decreased insight into safety/deficits.   General Comments       Exercises Other Exercises Other Exercises: OT educates re: role of OT, importance of OOB Activity and encourages pt to participate in fxl mobility/transfers/mobilizing more when PT assesses her today. Pt agreeable.   Shoulder Instructions      Home Living Family/patient expects to be discharged to:: Private residence Living Arrangements: Children;Other (Comment) (dtr)   Type of Home: House Home Access: Level entry     Home Layout: 1/2 bath on main level;Able to live on main level with bedroom/bathroom;Two level Alternate Level Stairs-Number of Steps: 15             Home Equipment: Cane - single point          Prior Functioning/Environment Level of Independence: Needs assistance  Gait / Transfers Assistance Needed: reports using [facility] for fxl mobility both in and out of the home ADL's / Homemaking Assistance Needed: reports being able to perform all basic self care, states dtr does all HH IADLs including driving, getting groceries, cooking and cleaning.   Comments: pt reports that dtr works during the day, (apparently inconsistent schedule?)        OT Problem List: Decreased strength;Decreased activity tolerance      OT Treatment/Interventions: Self-care/ADL training;Therapeutic exercise;Patient/family education;Therapeutic activities;DME and/or AE instruction    OT Goals(Current goals can be found in the care plan section) Acute Rehab OT Goals Patient Stated Goal: go home OT Goal Formulation: With patient Time For Goal Achievement: 11/30/20 Potential to Achieve Goals: Good  OT Frequency: Min 1X/week   Barriers to D/C:            Co-evaluation              AM-PAC OT "6 Clicks" Daily Activity     Outcome Measure Help from another person eating meals?: None Help from another person taking care of personal grooming?: A  Little Help from another person toileting, which includes using toliet, bedpan, or urinal?: A Little Help from another person bathing (including washing, rinsing, drying)?: A Little Help from another person to put on and taking off regular upper body clothing?: None Help from another person to put on and taking off regular lower body clothing?: A Lot 6 Click Score: 19   End of Session    Activity Tolerance: Patient tolerated treatment well Patient left: in bed;with call bell/phone within reach  OT Visit Diagnosis: Unsteadiness on feet (R26.81);Muscle weakness (generalized) (M62.81)                Time: 3149-7026 OT Time Calculation (min): 17 min Charges:  OT General Charges $OT Visit: 1 Visit OT Evaluation $OT Eval Moderate Complexity: 1 Mod OT Treatments $Self Care/Home Management : 8-22  mins  Gerrianne Scale, [place], OTR/L ascom [phone] 11/16/20, 5:08 PM

## 2020-11-16 NOTE — ED Notes (Signed)
Speech at bedside

## 2020-11-16 NOTE — Plan of Care (Signed)
PMT note:  Consult noted. Patient is currently off unit and unavailable. Will reattempt at a later time.

## 2020-11-16 NOTE — ED Notes (Signed)
Per Ovid Curd at pharmacy, [place] to run sodium phosphate Y lined to NS.

## 2020-11-16 NOTE — ED Notes (Signed)
Pt to MRI

## 2020-11-16 NOTE — ED Notes (Signed)
Pt refusing ambulation with OT

## 2020-11-16 NOTE — ED Notes (Signed)
PT at bedside.

## 2020-11-16 NOTE — ED Notes (Signed)
Patient irritable and confused, unable to follow commands. MRI tech at bedside to take patient for study, refusing to answer questions. Attempting to hit staff. Will attempt MRI when patient is cooperative. Dr. Roel Cluck notified.

## 2020-11-16 NOTE — ED Notes (Addendum)
OT at bedside. 

## 2020-11-16 NOTE — Progress Notes (Addendum)
[facility] NAME: Jasmine [name]    MR#:  [id number]  DATE OF BIRTH:  [birth year]  SUBJECTIVE:  patient seen in the emergency room. Came in with decreased mentation lethargy and weakness. Family in the room. Patient tolerating soft diet. No respiratory distress. Sats 96% on room air.  REVIEW OF SYSTEMS:   Review of Systems  Unable to perform ROS: Mental status change  Tolerating Diet: Tolerating PT:   DRUG ALLERGIES:  No Known Allergies  VITALS:  Blood pressure 116/70, pulse 90, temperature 99.3 F (37.4 C), temperature source Oral, resp. rate 20, height 5\' 5"  (1.651 m), weight 50.8 kg, SpO2 100 %.  PHYSICAL EXAMINATION:   Physical Exam  GENERAL:  65 y.o.-year-old patient lying in the bed with no acute distress.  Appears chronically ill LUNGS: decreased breath sounds bilaterally, no wheezing, rales, rhonchi. No use of accessory muscles of respiration.  CARDIOVASCULAR: S1, S2 normal. No murmurs, rubs, or gallops.  ABDOMEN: Soft, nontender, nondistended. Bowel sounds present. No organomegaly or mass.  EXTREMITIES: No cyanosis, clubbing or edema b/l.    NEUROLOGIC: unable to assess since patient is a bit sleepy. Moves all extremities well. PSYCHIATRIC:  patient is alert and oriented x 3.  SKIN: No obvious rash, lesion, or ulcer.   LABORATORY PANEL:  CBC Recent Labs  Lab 11/16/20 0445  WBC 12.9*  HGB 12.1  HCT 33.3*  PLT 194    Chemistries  Recent Labs  Lab 11/16/20 0620  NA 141  K 3.9  CL 107  CO2 25  GLUCOSE 109*  BUN 6*  CREATININE 0.74  CALCIUM 9.3  MG 1.7  AST 19  ALT 10  ALKPHOS 63  BILITOT 0.9   Cardiac Enzymes No results for input(s): TROPONINI in the last 168 hours. RADIOLOGY:  DG Abd 1 View  Result Date: 11/15/2020 CLINICAL DATA:  Weakness, confusion, constipation EXAM: ABDOMEN - 1 VIEW COMPARISON:  CT 10/07/2014 FINDINGS: No high-grade obstructive bowel gas pattern. Moderate colonic stool  burden. Numerous phleboliths in the pelvis. Surgical clips in the right upper quadrant. No suspicious abdominal calcifications. Catch catheter overlies the pelvis. Telemetry leads overlie the upper abdomen. Degenerative changes in the spine, hips and pelvis. IMPRESSION: No high-grade obstruction.  Moderate stool burden. Electronically Signed   By: Lovena Le M.D.   On: 11/15/2020 23:11   CT Head Wo Contrast  Result Date: 11/15/2020 CLINICAL DATA:  Altered mental status, increasing confusion EXAM: CT HEAD WITHOUT CONTRAST TECHNIQUE: Contiguous axial images were obtained from the base of the skull through the vertex without intravenous contrast. COMPARISON:  None. FINDINGS: Brain: Postsurgical changes from broad craniectomy with a mildly sunken appearance of the dural flap, indeterminate particularly in the absence of comparison. Some overlying dural calcifications are noted. Regions of encephalomalacia seen in the right frontal, temporal and parietal lobe, insula and basal ganglia. More age indeterminate left left pontine hypoattenuation (4/12). Remote lacunar type infarcts are seen in the right caudate and the right cerebellar hemisphere. No other acute or suspicious intracranial abnormality. No hyperdense hemorrhage or extra-axial collection. No significant midline shift. Basal cisterns are patent. Background of patchy microvascular changes. Vascular: Atherosclerotic calcification of the carotid siphons. No hyperdense vessel. Skull: Broad right frontotemporoparietal craniectomy. No other acute or conspicuous osseous abnormalities. Sinuses/Orbits: Paranasal sinuses and mastoid air cells are predominantly clear. Debris in the bilateral external auditory canals. Included orbital structures are unremarkable. Other: None IMPRESSION: 1. Postsurgical changes from a right frontotemporoparietal craniectomy.  Extensive subjacent remote appearing encephalomalacia in the right frontal, temporal, parietal lobes, insula and  basal ganglia. Slightly depressed appearance of the dural flap is nonspecific bleeding, possibly a chronic finding in this patient though comparison imaging is unavailable at the time of exam. 2. Age indeterminate hypoattenuation in the left pons. If there is concern for acute process, consider MR imaging for further characterization. 3. Punctate foci likely reflecting remote lacunar infarcts in the right caudate and cerebellum. 4. Intracranial atherosclerosis and patchy microvascular angiopathy. 5. Debris in the bilateral external auditory canals, correlate for cerumen impaction. Electronically Signed   By: Lovena Le M.D.   On: 11/15/2020 22:27   MR BRAIN WO CONTRAST  Addendum Date: 11/16/2020   ADDENDUM REPORT: 11/16/2020 12:04 ADDENDUM: Findings discussed with Dr. Posey Pronto via telephone at 11:56 a.m. Electronically Signed   By: Margaretha Sheffield MD   On: 11/16/2020 12:04   Result Date: 11/16/2020 CLINICAL DATA:  Stroke follow-up. EXAM: MRI HEAD WITHOUT CONTRAST TECHNIQUE: Multiplanar, multiecho pulse sequences of the brain and surrounding structures were obtained without intravenous contrast. COMPARISON:  CT head November 15, 2020. FINDINGS: Brain: No acute infarct. Encephalomalacia in the right MCA territory, including the right basal ganglia, frontal, parietal and anterior temporal lobes with surrounding gliosis. Additional mild to moderate scattered T2/FLAIR hyperintensities within the white matter, compatible with chronic microvascular ischemic disease. Remote infarct in the left pons. Small remote right cerebellar lacunar infarct. Large right frontoparietal craniectomy with inward concavity of the skin flap. There is mild associated mass effect on the adjacent right cerebrum and mild (2 mm) associated leftward midline shift at the foramen of [place]. No acute hemorrhage, hydrocephalus, mass lesion or extra-axial fluid collection. Vascular: Major arterial flow voids are maintained at the skull base. Skull  and upper cervical spine: Prior large right frontoparietal craniectomy with inward deformity of the dural flap. Otherwise, normal calvarial marrow signal. Sinuses/Orbits: Largely clear sinuses.  Unremarkable orbits. Other: No sizable mastoid effusions. IMPRESSION: 1. No evidence of acute infarct. 2. Encephalomalacia and gliosis in the right MCA territory, including the right basal ganglia, frontal, parietal and anterior temporal lobes. 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 3. Large right frontoparietal craniectomy with inward concavity of the skin flap, compatible with sinking skin flap syndrome. There is mild associated mass effect on the adjacent right cerebrum and mild (2 mm) leftward midline shift at the foramen of [place]. Correlation with outside priors may be useful (if available) to assess the stability of this finding. 4. Additional mild to moderate chronic microvascular disease Electronically Signed: By: Margaretha Sheffield MD On: 11/16/2020 11:07   DG Chest Port 1 View  Result Date: 11/15/2020 CLINICAL DATA:  Confusion and weakness EXAM: PORTABLE CHEST 1 VIEW COMPARISON:  June 27, 2015 FINDINGS: There is atelectatic change in the left base. Elsewhere interstitium is somewhat thickened. Heart size and pulmonary vascularity normal. No adenopathy. Postoperative change in right humerus noted. IMPRESSION: Atelectasis left base. Earliest changes of pneumonia this area questioned. Elsewhere there is interstitial thickening likely represents a degree of chronic bronchitis. The heart size is normal. No evident adenopathy. Electronically Signed   By: Lowella Grip III M.D.   On: 11/15/2020 20:07   ASSESSMENT AND PLAN:  NADENE WITHERSPOON is a 65 y.o. female with medical history significant of hyperlipidemia, TBI Presented with   fatigue and altered mental status.  History given by daughter.  Not acting like himself repeating sentences.  Generalized fatigue had a fall and could not get  up.  Acute metabolic encephalopathy secondary to poor PO intake and possible aspiration pneumonia. -- patient has history of chronic dysphagia in the setting of traumatic brain injury and craniotomy in the past -- IV unasyn -- she has been evaluated by speech therapist. Patient does not tolerate nectar thick and has been noncompliant by drinking water from the tap at home. -- follow-up speech therapy recommendations. Daughter according to speech therapist is aware patient is at a high risk for aspiration. 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 Discussed with Dr. Izora Ribas and recommended no acute intervention at present how were happy to follow-up as outpatient for possible replacing her bone flap back.  LOw Mag --replete orally  generalized weakness  --PT OT to see patient  palliative care consultation to discuss goals of care given multiple comorbidities  Family communication : Consults : palliative care CODE STATUS: full code DVT Prophylaxis : Lovenox Level of care: Med-Surg Status is: Inpatient  Remains inpatient appropriate because:Inpatient level of care appropriate due to severity of illness  Dispo: The patient is from: Home              Anticipated d/c is to:  TBD              Patient currently is not medically stable to d/c.   Difficult to place patient No        TOTAL TIME TAKING CARE OF THIS PATIENT: 35 minutes.  >50% time spent on counselling and coordination of care  Note: This dictation was prepared with Dragon dictation along with smaller phrase technology. Any transcriptional errors that result from this process  are unintentional.  Fritzi Mandes M.D    [facility]   CC: Primary care physician; [facility], [place] Patient ID: Jasmine [name], female   DOB: [birth year], 65 y.o.   MRN: [id number]

## 2020-11-16 NOTE — Evaluation (Signed)
Physical Therapy Evaluation Patient Details Name: SELIN EISLER MRN: [id number] DOB: 07/21/1955 Today's Date: 11/16/2020   History of Present Illness  65 y.o. female with medical history significant of hyperlipidemia, TBI        Presented with   fatigue and altered mental status.  Recent generalized fatigue, admitted with sepsis.  Clinical Impression  Pt initially not wanting to do much of anything but did agree to get up and do a little walk to show me that she is safe.  She did do relatively well with ~75 ft of ambulation (holding either single HHA or IV pole, baseline she uses cane).  Pt had no LOBs but did have a limp on right, reports middle toe nail pain, on inspection the nail was long and starting to curl into dorsum of toe, nursing notified.  She reports been essentially at baseline and though she does not get out of the home much feels confident getting around in the home and managing with the assist from daughter for IADLs.  Pt not interested in HHPT and did well enough and is close enough to baseline that she is safe to return home w/o further PT f/u once medically cleared for d/c.     Follow Up Recommendations No PT follow up;Supervision - Intermittent    Equipment Recommendations  None recommended by PT    Recommendations for Other Services       Precautions / Restrictions Precautions Precautions: Fall Restrictions Weight Bearing Restrictions: No      Mobility  Bed Mobility Overal bed mobility: Modified Independent             General bed mobility comments: Pt able to get to EOB w/o hesitation or need for assist    Transfers Overall transfer level: Needs assistance Equipment used: 1 person hand held assist;None Transfers: Sit to/from Stand Sit to Stand: Min guard         General transfer comment: Close supervision 2/2 some impulsivity, she did not particularly want to work with PT/do much, but was willing if it ment I'd leave her alone and facilitate  getting home  Ambulation/Gait Ambulation/Gait assistance: Min guard Gait Distance (Feet): 75 Feet Assistive device: 1 person hand held assist;IV Pole       General Gait Details: Pt with some hesitancy with WBing on the R (c/o toenail pain, inspected and noted to be long/unkept, nursing notified)  Stairs            Wheelchair Mobility    Modified Rankin (Stroke Patients Only)       Balance Overall balance assessment: Mild deficits observed, not formally tested (no LOBs and relatively good confidence with UEs on some support (either HHA or IV pole))                                           Pertinent Vitals/Pain      Home Living Family/patient expects to be discharged to:: Private residence Living Arrangements: Children     Home Access: Level entry     Home Layout: 1/2 bath on main level;Able to live on main level with bedroom/bathroom;Two level Home Equipment: Cane - single point      Prior Function Level of Independence: Independent with assistive device(s)         Comments: apparently she rarely ever leaves the home but is able to manage ADLs w/o much assist,  daughter at work (apparently inconsistent schedule?)     Hand Dominance        Extremity/Trunk Assessment   Upper Extremity Assessment Upper Extremity Assessment: Generalized weakness    Lower Extremity Assessment Lower Extremity Assessment: Generalized weakness       Communication   Communication: No difficulties  Cognition Arousal/Alertness:  (c/o significant burning with urination) Behavior During Therapy: Restless Overall Cognitive Status: Within Functional Limits for tasks assessed (h/o TBI, unsure of baseline but appears likely close)                                        General Comments General comments (skin integrity, edema, etc.): Pt on room air during ambulation, sats remained in the high 90s with no overt shortness of breath of c/o  fatigue    Exercises     Assessment/Plan    PT Assessment Patent does not need any further PT services  PT Problem List Pain;Decreased safety awareness;Decreased activity tolerance       PT Treatment Interventions      PT Goals (Current goals can be found in the Care Plan section)  Acute Rehab PT Goals Patient Stated Goal: go home PT Goal Formulation: All assessment and education complete, DC therapy    Frequency     Barriers to discharge        Co-evaluation               AM-PAC PT "6 Clicks" Mobility  Outcome Measure Help needed turning from your back to your side while in a flat bed without using bedrails?: None Help needed moving from lying on your back to sitting on the side of a flat bed without using bedrails?: None Help needed moving to and from a bed to a chair (including a wheelchair)?: None Help needed standing up from a chair using your arms (e.g., wheelchair or bedside chair)?: None Help needed to walk in hospital room?: A Little Help needed climbing 3-5 steps with a railing? : A Little 6 Click Score: 22    End of Session Equipment Utilized During Treatment: Gait belt Activity Tolerance: Patient tolerated treatment well Patient left: with bed alarm set;with call bell/phone within reach Nurse Communication: Mobility status (R 3rd toenail issue, O2 during ambulation on room air) PT Visit Diagnosis: Difficulty in walking, not elsewhere classified (R26.2);Muscle weakness (generalized) (M62.81)    Time: 3244-0102 PT Time Calculation (min) (ACUTE ONLY): 17 min   Charges:   PT Evaluation $PT Eval Low Complexity: 1 Low          Kreg Shropshire, DPT 11/16/2020, 9:41 AM

## 2020-11-17 ENCOUNTER — Other Ambulatory Visit: Payer: Self-pay

## 2020-11-17 DIAGNOSIS — Z7189 Other specified counseling: Secondary | ICD-10-CM

## 2020-11-17 DIAGNOSIS — Z515 Encounter for palliative care: Secondary | ICD-10-CM

## 2020-11-17 LAB — LEGIONELLA PNEUMOPHILA SEROGP 1 UR AG: L. pneumophila Serogp 1 Ur Ag: NEGATIVE

## 2020-11-17 LAB — HEMOGLOBIN A1C
Hgb A1c MFr Bld: <4.2 % — ABNORMAL LOW (ref 4.8–5.6)
Mean Plasma Glucose: <74 mg/dL

## 2020-11-17 MED ORDER — CITALOPRAM HYDROBROMIDE 20 MG PO TABS: 20.0000 mg | ORAL_TABLET | Freq: Every day | ORAL | Status: DC

## 2020-11-17 MED ORDER — OXYBUTYNIN CHLORIDE ER 10 MG PO TB24: 10.0000 mg | ORAL_TABLET | Freq: Every day | ORAL | Status: DC

## 2020-11-17 MED ORDER — ASPIRIN EC 81 MG PO TBEC
81.0000 mg | DELAYED_RELEASE_TABLET | Freq: Every day | ORAL | Status: DC
  Administered 2020-11-18: 81 mg via ORAL
  Filled 2020-11-17: qty 1

## 2020-11-17 MED ORDER — TRAZODONE HCL 50 MG PO TABS
50.0000 mg | ORAL_TABLET | Freq: Every day | ORAL | Status: DC
  Administered 2020-11-17: 50 mg via ORAL
  Filled 2020-11-17: qty 1

## 2020-11-17 MED ORDER — AMOXICILLIN-POT CLAVULANATE 875-125 MG PO TABS
1.0000 | ORAL_TABLET | Freq: Two times a day (BID) | ORAL | Status: DC
  Administered 2020-11-17 – 2020-11-18 (×2): 1 via ORAL
  Filled 2020-11-17 (×2): qty 1

## 2020-11-17 MED ORDER — DOCUSATE SODIUM 100 MG PO CAPS
100.0000 mg | ORAL_CAPSULE | Freq: Two times a day (BID) | ORAL | Status: DC
  Administered 2020-11-18: 100 mg via ORAL
  Filled 2020-11-17 (×2): qty 1

## 2020-11-17 MED ORDER — TAMSULOSIN HCL 0.4 MG PO CAPS: 0.4000 mg | ORAL_CAPSULE | Freq: Every day | ORAL | Status: DC

## 2020-11-17 NOTE — Progress Notes (Signed)
[facility] [place] NAME: Jasmine [name]    MR#:  [id number]  DATE OF BIRTH:  1956-02-15  SUBJECTIVE:  patient seen in the emergency room. Came in with decreased mentation lethargy and weakness. No Family in the room. Patient tolerating soft diet. No respiratory distress. Sats 96% on room air. Sleepy but answers questions  REVIEW OF SYSTEMS:   Review of Systems  Constitutional:  Negative for chills, fever and weight loss.  HENT:  Negative for ear discharge, ear pain and nosebleeds.   Eyes:  Negative for blurred vision, pain and discharge.  Respiratory:  Negative for sputum production, shortness of breath, wheezing and stridor.   Cardiovascular:  Negative for chest pain, palpitations, orthopnea and PND.  Gastrointestinal:  Negative for abdominal pain, diarrhea, nausea and vomiting.  Genitourinary:  Negative for frequency and urgency.  Musculoskeletal:  Negative for back pain and joint pain.  Neurological:  Positive for weakness. Negative for sensory change, speech change and focal weakness.  Psychiatric/Behavioral:  Negative for depression and hallucinations. The patient is not nervous/anxious.   Tolerating Diet:yes Tolerating PT: NO PT needs  DRUG ALLERGIES:  No Known Allergies  VITALS:  Blood pressure 116/70, pulse (!) 53, temperature 98.7 F (37.1 C), temperature source Oral, resp. rate 13, height 5\' 5"  (1.651 m), weight 50.8 kg, SpO2 100 %.  PHYSICAL EXAMINATION:   Physical Exam  GENERAL:  65 y.o.-year-old patient lying in the bed with no acute distress.  Appears chronically ill LUNGS: decreased breath sounds bilaterally, no wheezing, rales, rhonchi. No use of accessory muscles of respiration.  CARDIOVASCULAR: S1, S2 normal. No murmurs, rubs, or gallops.  ABDOMEN: Soft, nontender, nondistended. Bowel sounds present. No organomegaly or mass.  EXTREMITIES: No cyanosis, clubbing or edema b/l.    NEUROLOGIC: unable to assess since  patient is a bit sleepy. Moves all extremities well. PSYCHIATRIC:  patient is alert and oriented x 3.  SKIN: No obvious rash, lesion, or ulcer.   LABORATORY PANEL:  CBC Recent Labs  Lab 11/16/20 0445  WBC 12.9*  HGB 12.1  HCT 33.3*  PLT 194     Chemistries  Recent Labs  Lab 11/16/20 0620  NA 141  K 3.9  CL 107  CO2 25  GLUCOSE 109*  BUN 6*  CREATININE 0.74  CALCIUM 9.3  MG 1.7  AST 19  ALT 10  ALKPHOS 63  BILITOT 0.9    Cardiac Enzymes No results for input(s): TROPONINI in the last 168 hours. RADIOLOGY:  DG Abd 1 View  Result Date: 11/15/2020 CLINICAL DATA:  Weakness, confusion, constipation EXAM: ABDOMEN - 1 VIEW COMPARISON:  CT 10/07/2014 FINDINGS: No high-grade obstructive bowel gas pattern. Moderate colonic stool burden. Numerous phleboliths in the pelvis. Surgical clips in the right upper quadrant. No suspicious abdominal calcifications. Catch catheter overlies the pelvis. Telemetry leads overlie the upper abdomen. Degenerative changes in the spine, hips and pelvis. IMPRESSION: No high-grade obstruction.  Moderate stool burden. Electronically Signed   By: Lovena Le M.D.   On: 11/15/2020 23:11   CT Head Wo Contrast  Result Date: 11/15/2020 CLINICAL DATA:  Altered mental status, increasing confusion EXAM: CT HEAD WITHOUT CONTRAST TECHNIQUE: Contiguous axial images were obtained from the base of the skull through the vertex without intravenous contrast. COMPARISON:  None. FINDINGS: Brain: Postsurgical changes from broad craniectomy with a mildly sunken appearance of the dural flap, indeterminate particularly in the absence of comparison. Some overlying dural calcifications are noted. Regions of encephalomalacia  seen in the right frontal, temporal and parietal lobe, insula and basal ganglia. More age indeterminate left left pontine hypoattenuation (4/12). Remote lacunar type infarcts are seen in the right caudate and the right cerebellar hemisphere. No other acute or  suspicious intracranial abnormality. No hyperdense hemorrhage or extra-axial collection. No significant midline shift. Basal cisterns are patent. Background of patchy microvascular changes. Vascular: Atherosclerotic calcification of the carotid siphons. No hyperdense vessel. Skull: Broad right frontotemporoparietal craniectomy. No other acute or conspicuous osseous abnormalities. Sinuses/Orbits: Paranasal sinuses and mastoid air cells are predominantly clear. Debris in the bilateral external auditory canals. Included orbital structures are unremarkable. Other: None IMPRESSION: 1. Postsurgical changes from a right frontotemporoparietal craniectomy. Extensive subjacent remote appearing encephalomalacia in the right frontal, temporal, parietal lobes, insula and basal ganglia. Slightly depressed appearance of the dural flap is nonspecific bleeding, possibly a chronic finding in this patient though comparison imaging is unavailable at the time of exam. 2. Age indeterminate hypoattenuation in the left pons. If there is concern for acute process, consider MR imaging for further characterization. 3. Punctate foci likely reflecting remote lacunar infarcts in the right caudate and cerebellum. 4. Intracranial atherosclerosis and patchy microvascular angiopathy. 5. Debris in the bilateral external auditory canals, correlate for cerumen impaction. Electronically Signed   By: Lovena Le M.D.   On: 11/15/2020 22:27   MR BRAIN WO CONTRAST  Addendum Date: 11/16/2020   ADDENDUM REPORT: 11/16/2020 12:04 ADDENDUM: Findings discussed with Dr. Posey Pronto via telephone at 11:56 a.m. Electronically Signed   By: Margaretha Sheffield MD   On: 11/16/2020 12:04   Result Date: 11/16/2020 CLINICAL DATA:  Stroke follow-up. EXAM: MRI HEAD WITHOUT CONTRAST TECHNIQUE: Multiplanar, multiecho pulse sequences of the brain and surrounding structures were obtained without intravenous contrast. COMPARISON:  CT head November 15, 2020. FINDINGS: Brain: No acute  infarct. Encephalomalacia in the right MCA territory, including the right basal ganglia, frontal, parietal and anterior temporal lobes with surrounding gliosis. Additional mild to moderate scattered T2/FLAIR hyperintensities within the white matter, compatible with chronic microvascular ischemic disease. Remote infarct in the left pons. Small remote right cerebellar lacunar infarct. Large right frontoparietal craniectomy with inward concavity of the skin flap. There is mild associated mass effect on the adjacent right cerebrum and mild (2 mm) associated leftward midline shift at the foramen of [place]. No acute hemorrhage, hydrocephalus, mass lesion or extra-axial fluid collection. Vascular: Major arterial flow voids are maintained at the skull base. Skull and upper cervical spine: Prior large right frontoparietal craniectomy with inward deformity of the dural flap. Otherwise, normal calvarial marrow signal. Sinuses/Orbits: Largely clear sinuses.  Unremarkable orbits. Other: No sizable mastoid effusions. IMPRESSION: 1. No evidence of acute infarct. 2. Encephalomalacia and gliosis in the right MCA territory, including the right basal ganglia, frontal, parietal and anterior temporal lobes. 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 3. Large right frontoparietal craniectomy with inward concavity of the skin flap, compatible with sinking skin flap syndrome. There is mild associated mass effect on the adjacent right cerebrum and mild (2 mm) leftward midline shift at the foramen of [place]. Correlation with outside priors may be useful (if available) to assess the stability of this finding. 4. Additional mild to moderate chronic microvascular disease Electronically Signed: By: Margaretha Sheffield MD On: 11/16/2020 11:07   DG Chest Port 1 View  Result Date: 11/15/2020 CLINICAL DATA:  Confusion and weakness EXAM: PORTABLE CHEST 1 VIEW COMPARISON:  June 27, 2015 FINDINGS: There is atelectatic change in  the left base.  Elsewhere interstitium is somewhat thickened. Heart size and pulmonary vascularity normal. No adenopathy. Postoperative change in right humerus noted. IMPRESSION: Atelectasis left base. Earliest changes of pneumonia this area questioned. Elsewhere there is interstitial thickening likely represents a degree of chronic bronchitis. The heart size is normal. No evident adenopathy. Electronically Signed   By: Lowella Grip III M.D.   On: 11/15/2020 20:07   ASSESSMENT AND PLAN:  ASA FATH is a 65 y.o. female with medical history significant of hyperlipidemia, TBI Presented with   fatigue and altered mental status.  History given by daughter.  Not acting like himself repeating sentences.  Generalized fatigue had a fall and could not get up.  Acute metabolic encephalopathy secondary to poor PO intake and possible aspiration pneumonia. -- patient has history of chronic dysphagia in the setting of traumatic brain injury and craniotomy in the past -- IV unasyn--change to oral in am -- she has been evaluated by speech therapist. Patient does not tolerate nectar thick and has been noncompliant by drinking water from the tap at home. -- follow-up speech therapy recommendations. Daughter according to speech therapist is aware patient is at a high risk for aspiration. 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 Discussed with Dr. Izora Ribas and recommended no acute intervention at present how were happy to follow-up as outpatient for possible replacing her bone flap back.  LOw Mag --replete orally  generalized weakness  --PT OT to  see patient--NO PT needs  palliative care consultation to discuss goals of care given multiple comorbidities  Family communication :dter Alma Downs on the phone Consults : palliative care CODE STATUS: full code DVT Prophylaxis : Lovenox Level of care: Med-Surg Status is: Inpatient  Remains inpatient appropriate because:Inpatient level of care appropriate due to severity of illness  Dispo: The patient is from: Home              Anticipated d/c is to:  TBD              Patient currently is not medically stable to d/c.   Difficult to place patient No   patient continues to improve medically. She is a bit more sleepy today. Will continue to monitor for one more day if remains stable will discharged tomorrow. Daughter in agreement.     TOTAL TIME TAKING CARE OF THIS PATIENT: 25 minutes.  >50% time spent on counselling and coordination of care  Note: This dictation was prepared with Dragon dictation along with smaller phrase technology. Any transcriptional errors that result from this process are unintentional.  Fritzi Mandes M.D    [facility]   CC: Primary care physician; [facility] [place] Patient ID: Jasmine [name], female   DOB: 06-Nov-1955, 65 y.o.   MRN: [id number]

## 2020-11-17 NOTE — Progress Notes (Addendum)
Speech Language Pathology Treatment: Dysphagia  Patient Details Name: Jasmine [name] MRN: [id number] DOB: 1956/05/11 Today's Date: 11/17/2020 Time: 0762-2633 SLP Time Calculation (min) (ACUTE ONLY): 45 min  Assessment / Plan / Recommendation Clinical Impression  Pt appears to present w/ functional oropharyngeal phase swallowing w/ food consistencies, however, overt clinical s/s of aspiration were noted during session today w/ thin liquids -- pt consumed more po trials during the Lunch meal. She has a h/o dysphagia per chart notes.  Per chart, pt has had some degree of dysphagia since 2014-2015; 2021: MBSS revealed - "IMPRESSION:  1. Aspiration with thin liquids. 2. Markedly delayed oral phase on all consistencies. 3. Flash penetration with nectar thick liquids.  Pt remained on a "Soft diet w/ thin liquids" post that assessment per chart notes. CXR results this admit reveal a "degree of chronic bronchitis". Suspect this could be related to aspiration(?).  Pt also has a h/o TBI and stroke -- suspect a degree of Cognitive decline Baseline. Pt resided in a [place] previously b/f living w/ Dtr currently(Dtr works during the day).  ANY Cognitive decline and/or Baseline Dysphagia increases risk for aspiration of po's. Pt is also Edentulous; this impacts effective mastication of solids.   Pt appears to present w/ increased risk for aspiration thus risk for Pulmonary decline.   Pt's Dtr stated pt only eats minimal amounts at home but enjoys chocolate ice cream and candy. She gives her Ensure drinks often during the day.   At the Lunch meal today, pt was agreeable to eating/drinking more po's than at initial evaluation. She fed self(post setup) trials of Minced foods(dys. Level 2) w/ no immediate/delayed, overt s/s of aspiration during the trials. However, w/ trials of thin liquids via Cup intermittently b/t foods, pt exhibited both delayed throat clearing and Cough(x2) which appeared directly related to  the drinking/swallowing of thin liquids. No overt decline in respiratory presentation noted during/post trials.  Oral phase appeared adequate for bolus management and control of bolus propulsion for A-P transfer for swallowing w/ the Minced foods. Min+ increased oral phase time as pt mashed/gummed the increased textured foods. Adequate oral clearing noted w/ trials. Pt fed self w/ setup support. Noted some Impulsivity and decreased overall awareness and insight into her deficits and follow through w/ precautions given.   Recommend a (Dys. 2) MINCED foods consistency diet w/ gravies and moistened foods; Nectar liquids VIA CUP for best control d/t increased s/s of aspiration w/ thin liquids. Recommend general aspiration precautions, strategies to include Chin Tuck and Throat clear/Cough during and after oral intake(to clear throat). Pills WHOLE vs CRUSHED in Puree for safer, easier swallowing d/t suspected Cognitive decline and Baseline Dysphagia. Monitoring during meals for follow through w/ precautions/strategies.   Education given on Pills in Puree; food consistencies and easy to eat options; general aspiration precautions to pt/NSG.  Education given on her dysphagia and dysphagia history; need for MBSS and thickened liquids at this time. MD/NSG updated. Discussed POC w/ Palliative Care.      HPI HPI: Pt is a 65 y.o. female with medical history significant of hyperlipidemia, TBI, " BRAIN SURGERY", CVA, dysphagia per chart notes and tobacco use.  Admitted for CAP, acute encephalopathy.  Recent illness in family per report.  CXR: "Atelectasis left base. Earliest changes of pneumonia this area  questioned. Elsewhere there is interstitial thickening likely  represents a degree of chronic bronchitis.".      SLP Plan  Continue with current plan of care;MBS;New goals to be  determined pending instrumental study       Recommendations  Diet recommendations: Dysphagia 2 (fine chop);Nectar-thick  liquid Liquids provided via: Cup Medication Administration: Whole meds with puree (vs Crushed in Puree for safer swallowing) Supervision: Patient able to self feed;Intermittent supervision to cue for compensatory strategies (setup support) Compensations: Minimize environmental distractions;Slow rate;Small sips/bites;Lingual sweep for clearance of pocketing;Follow solids with liquid Postural Changes and/or Swallow Maneuvers: Out of bed for meals;Seated upright 90 degrees;Upright 30-60 min after meal                General recommendations:  (Palliative Care f/u; Dietician support) Oral Care Recommendations: Oral care BID;Oral care before and after PO;Staff/trained caregiver to provide oral care Follow up Recommendations:  (TBD) SLP Visit Diagnosis: Dysphagia, oropharyngeal phase (R13.12) (baseline; Cognitive decline baseline) Plan: Continue with current plan of care;MBS;New goals to be determined pending instrumental study       [place], [place], [place] Pathologist [facility] [phone] [facility] 11/17/2020, 3:43 PM

## 2020-11-17 NOTE — Consult Note (Signed)
Consultation Note Date: 11/17/2020   Patient Name: Jasmine [name]  DOB: 1956/04/16  MRN: [id number]  Age / Sex: 65 y.o., female  PCP: [facility] [place] Referring Physician: Fritzi Mandes, MD  Reason for Consultation: Establishing goals of care  HPI/Patient Profile: Jasmine [name] is a 65 y.o. female with medical history significant of hyperlipidemia, TBI.    Clinical Assessment and Goals of Care: Notes reviewed.  Patient is resting in bed. She states she is not married and has 1/2 children living. She lives at home with her daughter. She states she "was in and out of rest homes".    We discussed her diagnoses, prognosis, GOC. During my visit, nurse gave her Miralax. Patient took 2 sips and had coughing after both sips. She states she has had difficulty with swallowing for years.   A detailed discussion was had today regarding advanced directives.  Concepts specific to code status, artifical feeding and hydration, IV antibiotics and rehospitalization were discussed.  The difference between an aggressive medical intervention path and a comfort care path was discussed.  Values and goals of care important to patient and family were attempted to be elicited.  Discussed limitations of medical interventions to prolong quality of life in some situations and discussed the concept of human mortality.  She states she enjoys eating chocolate ice cream and candy. She states her appetite has been poor for a while, but some days she eats really well.   She states though she has difficulty with swallowing, her QOL is largely her intake of the foods she likes. She states she wants to be able to eat and drink what she wants, but if it means aspiration PNA and death, she wants "to do the safest thing". She states "I want to do what I can to live longer", even if it means giving up things she enjoys. She also  states she would never want a feeding tube, but with clarification, states she would have one placed if it meant life prolongation. Discussed any definitive limits or boundaries to care. She states she would want ventilator support if it were ever needed on a short term basis, but she would not want a tracheostomy even if it meant death.  She states she does not believe she would want CPR, but would think about this.     SUMMARY OF RECOMMENDATIONS   Considering a DNR status.         Primary Diagnoses: Present on Admission:  Sepsis ([place])  CAP (community acquired pneumonia)  Acute metabolic encephalopathy  Essential hypertension  Asthma  Constipation  Prolonged QT interval   I have reviewed the medical record, interviewed the patient and family, and examined the patient. The following aspects are pertinent.  Past Medical History:  Diagnosis Date   Asthma 11/15/2020   Hypertension    Social History   Socioeconomic History   Marital status: Single    Spouse name: Not on file   Number of children: Not on file   Years of  education: Not on file   Highest education level: Not on file  Occupational History   Not on file  Tobacco Use   Smoking status: Every Day    Packs/day: 0.75    Years: 46.00    Pack years: 34.50    Types: Cigarettes   Smokeless tobacco: Never  Substance and Sexual Activity   Alcohol use: No   Drug use: No   Sexual activity: Not Currently  Other Topics Concern   Not on file  Social History Narrative   Not on file   Social Determinants of Health   Financial Resource Strain: Not on file  Food Insecurity: Not on file  Transportation Needs: Not on file  Physical Activity: Not on file  Stress: Not on file  Social Connections: Not on file   Family History  Problem Relation Age of Onset   Breast cancer Neg Hx    Scheduled Meds:   stroke: mapping our early stages of recovery book   Does not apply Once   atorvastatin  40 mg Oral Daily   enoxaparin  (LOVENOX) injection  40 mg Subcutaneous Q24H   mometasone-formoterol  2 puff Inhalation BID   polyethylene glycol  17 g Oral BID   senna-docusate  1 tablet Oral QHS   traZODone  50 mg Oral QHS   Continuous Infusions:  ampicillin-sulbactam (UNASYN) IV 3 g (11/17/20 0937)   PRN Meds:.acetaminophen **OR** acetaminophen, albuterol Medications Prior to Admission:  Prior to Admission medications   Medication Sig Start Date End Date Taking? Authorizing Provider  aspirin EC 81 MG tablet Take 81 mg by mouth daily.    [provider]  atorvastatin (LIPITOR) 40 MG tablet Take 40 mg by mouth daily.    [provider]  citalopram (CELEXA) 20 MG tablet Take 20 mg by mouth daily.    [provider]  docusate sodium (COLACE) 100 MG capsule Take 100 mg by mouth 2 (two) times daily.    [provider]  Fluticasone-Salmeterol (ADVAIR) 250-50 MCG/DOSE AEPB Inhale 1 puff into the lungs every 12 (twelve) hours.    [provider]  gabapentin (NEURONTIN) 300 MG capsule Take 600 mg by mouth 3 (three) times daily.    [provider]  oxybutynin (DITROPAN-XL) 10 MG 24 hr tablet Take 1 tablet (10 mg total) by mouth daily. 11/29/17   MacDiarmid, Nicki Reaper, MD  polyethylene glycol (MIRALAX / GLYCOLAX) packet Take 17 g by mouth daily.    [provider]  tamsulosin (FLOMAX) 0.4 MG CAPS capsule Take 1 capsule (0.4 mg total) by mouth daily. 02/10/18   MacDiarmid, Nicki Reaper, MD  tazarotene (TAZORAC) 0.05 % cream Apply topically 2 (two) times daily.    [provider]  traZODone (DESYREL) 50 MG tablet Take 50 mg by mouth at bedtime.    [provider]   No Known Allergies Review of Systems  All other systems reviewed and are negative.  Physical Exam Pulmonary:     Effort: Pulmonary effort is normal.  Neurological:     Mental Status: She is alert.    Vital Signs: BP 116/70 (BP Location: Right Arm)   Pulse (!) 53   Temp 98.7 F (37.1 C) (Oral)    Resp 13   Ht 5\' 5"  (1.651 m)   Wt 50.8 kg   SpO2 100%   BMI 18.64 kg/m  Pain Scale: 0-10   Pain Score: 0-No pain   SpO2: SpO2: 100 % O2 Device:SpO2: 100 % O2 Flow Rate: .  IO: Intake/output summary:  Intake/Output Summary (Last 24 hours) at 11/17/2020 1218 Last data filed at 11/17/2020 0500 Gross per 24 hour  Intake 240 ml  Output --  Net 240 ml    LBM: Last BM Date:  (pt and daughter unsure of last BM) Baseline Weight: Weight: 50.8 kg Most recent weight: Weight: 50.8 kg        Time In: 9:00 Time Out: 9:40 Time Total: 40 min Greater than 50%  of this time was spent counseling and coordinating care related to the above assessment and plan.  Signed by: Asencion Gowda, NP   Please contact Palliative Medicine Team phone at [phone] for questions and concerns.  For individual provider: See Shea Evans

## 2020-11-18 ENCOUNTER — Inpatient Hospital Stay: Payer: Medicare Other

## 2020-11-18 DIAGNOSIS — R531 Weakness: Secondary | ICD-10-CM

## 2020-11-18 MED ORDER — AMOXICILLIN-POT CLAVULANATE 875-125 MG PO TABS: 1.0000 | ORAL_TABLET | Freq: Two times a day (BID) | ORAL | 0 refills | Status: AC

## 2020-11-18 NOTE — Discharge Instructions (Addendum)
High risk for aspiration--recommend puree with nectar thick liquids and medicine crushed in puree.

## 2020-11-18 NOTE — Care Management Important Message (Signed)
Important Message  Patient Details  Name: Jasmine [name] MRN: [id number] Date of Birth: March 25, 1956   Medicare Important Message Given:  N/A - LOS <3 / Initial given by admissions   Initial Medicare IM reviewed with [name], daughter by Thornton Dales, Patient Access Associate on 11/17/2020 at 8:35am.  Dannette Barbara 11/18/2020, 8:24 AM

## 2020-11-18 NOTE — Progress Notes (Signed)
Modified Barium Swallow Progress Note  Patient Details  Name: Jasmine [name] MRN: [id number] Date of Birth: 05-09-1956  Today's Date: 11/18/2020  Modified Barium Swallow completed.  Full report located under Chart Review in the Imaging Section.  Brief recommendations include the following:  Clinical Impression  Pt has documented history of chronic oropharyngeal dysphagia dating back to 2015. Chronic aspiration of thin liquids as also been documented along with documentation that pt continues to choose to consume thin liquids despite risks. Currently, pt remains at a very high risk of aspiration. During this study, pt presents with severe oral phase dysphagia and moderate sensory pharyngeal phase dysphagia. When consuming nectar thick liquids via cup, thin liquids via cup, and puree pt's oral phase is lengthy with anterior-posterior movement of bolus before posteriorly propelling bolus. Pt refers to her oral phase as "wallering" her food or liquid. This appears to be a chronic pattern. Pt's pharyngeal phase is c/b premature spillage to the pyriform sinuses with consistent deep penetration during swallow of thin liquids with eventual silent aspiration of thin liquids. Unfortunately, pt was not able to consistently engage in compensatory strategies. given the severity of pt's oral phase deficits, study was limited to puree items. At this time, recommend dysphagia 1 diet with nectar thick liquids via cup and medicine crushed in puree.   Swallow Evaluation Recommendations       SLP Diet Recommendations: Dysphagia 1 (Puree) solids;Nectar thick liquid   Liquid Administration via: Cup   Medication Administration: Crushed with puree   Supervision: Patient able to self feed   Compensations: Minimize environmental distractions;Slow rate;Small sips/bites   Postural Changes: Remain semi-upright after after feeds/meals (Comment);Seated upright at 90 degrees   Oral Care Recommendations: Oral care  BID   Other Recommendations: Order thickener from pharmacy;Prohibited food (jello, ice cream, thin soups);Remove water pitcher;Have oral suction available   Liba Hulsey B. Rutherford Nail M.S., CCC-SLP, [place] [facility] [place] 11/18/2020,3:23 PM

## 2020-11-18 NOTE — Progress Notes (Signed)
Daily Progress Note   Patient Name: Jasmine [name]       Date: 11/18/2020 DOB: 1956/04/18  Age: 65 y.o. MRN#: [id number] Attending Physician: Fritzi Mandes, MD Primary Care Physician: [place] Date: 11/15/2020  Reason for Consultation/Follow-up: Establishing goals of care  Subjective: Patient is resting in bed with eyes closed. She does not open them when I speak to her. Stepped out and called daughter to discuss swallowing evaluation, recommendations, and GOC moving forward. Daughter states she is walking into the hospital to pick her up to go home. Would recommend palliative to follow outpatient to discuss [place] as not adhering to the recommended diet could result in aspiration PNA again.    Length of Stay: 3  Current Medications: Scheduled Meds:    stroke: mapping our early stages of recovery book   Does not apply Once   amoxicillin-clavulanate  1 tablet Oral Q12H   aspirin EC  81 mg Oral Daily   atorvastatin  40 mg Oral Daily   docusate sodium  100 mg Oral BID   enoxaparin (LOVENOX) injection  40 mg Subcutaneous Q24H   mometasone-formoterol  2 puff Inhalation BID   polyethylene glycol  17 g Oral BID   senna-docusate  1 tablet Oral QHS   traZODone  50 mg Oral QHS    Continuous Infusions:   PRN Meds: acetaminophen **OR** acetaminophen, albuterol  Physical Exam Constitutional:      Comments: Eyes closed.             Vital Signs: BP 99/68 (BP Location: Right Arm)   Pulse (!) 56   Temp 98.7 F (37.1 C) (Oral)   Resp 14   Ht 5\' 5"  (1.651 m)   Wt 50.8 kg   SpO2 97%   BMI 18.64 kg/m  SpO2: SpO2: 97 % O2 Device: O2 Device: Room Air O2 Flow Rate:    Intake/output summary:  Intake/Output Summary (Last 24 hours) at 11/18/2020 1446 Last data  filed at 11/17/2020 1607 Gross per 24 hour  Intake --  Output 100 ml  Net -100 ml   LBM: Last BM Date:  (pt and daughter unsure of last BM) Baseline Weight: Weight: 50.8 kg Most recent weight: Weight: 50.8 kg        Patient Active Problem List   Diagnosis  Date Noted   Weakness    Constipation 11/16/2020   Prolonged QT interval 11/16/2020   Aspiration pneumonia due to gastric secretions ([place])    Sepsis ([place]) 11/15/2020   CAP (community acquired pneumonia) 73/22/5672   Acute metabolic encephalopathy 02/20/8021   Essential hypertension 11/15/2020   Asthma 11/15/2020    Palliative Care Assessment & Plan   Recommendations/Plan: D/Cing home at this time. Recommend palliative to follow outpatient to discuss diet recommendations, risk of aspiration PNA with nonadherence to the recommended diet, and GOC moving forward.    Code Status:    Code Status Orders  (From admission, onward)           Start     Ordered   11/15/20 2245  Full code  Continuous        11/15/20 2244           Code Status History     This patient has a current code status but no historical code status.       Prognosis:  Poor    Thank you for allowing the Palliative Medicine Team to assist in the care of this patient.       Total Time 15 min Prolonged Time Billed  no       Greater than 50%  of this time was spent counseling and coordinating care related to the above assessment and plan.  Asencion Gowda, NP  Please contact Palliative Medicine Team phone at [phone] for questions and concerns.

## 2020-11-18 NOTE — Progress Notes (Signed)
PT Cancellation Note  Patient Details Name: Jasmine [name] MRN: [id number] DOB: 1955-10-20   Cancelled Treatment:    Reason Eval/Treat Not Completed: Patient declined, no reason specified. Pt received in bed, tx attempted. Encouraged pt to get OOB to walk, sit in recliner, or perform bed level exercise & pt declined all activity. Will f/u as able.   Lavone Nian, PT, DPT 11/18/20, 10:51 AM    Waunita Schooner 11/18/2020, 10:51 AM

## 2020-11-18 NOTE — TOC Initial Note (Signed)
Transition of Care [facility]) - Initial/Assessment Note    Patient Details  Name: Jasmine [name] MRN: [id number] Date of Birth: [birth year]  Transition of Care [facility]) CM/SW Contact:    Beverly Sessions, RN Phone Number: 11/18/2020, 1:43 PM  Clinical Narrative:                  Patient admitted from home with sepsis Assessment completed via phone with daughter  Patient lives at home with daughter  PCP Juanda Crumble drew [facility] and [facility]  Daughter states she takes patient to appointments.  Denies issues obtaining medications  Patient ambulates with a cane at home  Daughter agreeable to home health services. States she does not have a preference of home health agency.  [facility].  Outpatient palliative referral made to [facility] with [employer].  Daughter to transport today at discharge   Expected Discharge Plan: [place] Barriers to Discharge: No Barriers Identified   Patient Goals and CMS Choice        Expected Discharge Plan and Services Expected Discharge Plan: [place]   Discharge Planning Services: CM Consult   Living arrangements for the past 2 months: Single Family Home Expected Discharge Date: 11/18/20                         HH Arranged: RN, Nurse's Aide, Social Work [employer] Agency: [place] Date [facility] Agency Contacted: 11/18/20   Representative spoke with at [place]: Tommi Rumps  Prior Living Arrangements/Services Living arrangements for the past 2 months: [place] Lives with:: Adult Children              Current home services: DME    Activities of Daily Living Home Assistive Devices/Equipment: None ADL Screening (condition at time of admission) Patient's cognitive ability adequate to safely complete daily activities?: Yes Is the patient deaf or have difficulty hearing?: No Does the patient have difficulty seeing, even when wearing glasses/contacts?:  No Does the patient have difficulty concentrating, remembering, or making decisions?: No Patient able to express need for assistance with ADLs?: Yes Does the patient have difficulty dressing or bathing?: Yes Independently performs ADLs?: No Communication: Independent Dressing (OT): Needs assistance Is this a change from baseline?: Pre-admission baseline Grooming: Needs assistance Is this a change from baseline?: Pre-admission baseline Feeding: Needs assistance Is this a change from baseline?: Pre-admission baseline Bathing: Needs assistance Is this a change from baseline?: Pre-admission baseline Toileting: Needs assistance Is this a change from baseline?: Pre-admission baseline In/Out Bed: Needs assistance Is this a change from baseline?: Pre-admission baseline Walks in Home: Independent Does the patient have difficulty walking or climbing stairs?: Yes Weakness of Legs: None Weakness of Arms/Hands: None  Permission Sought/Granted                  Emotional Assessment              Admission diagnosis:  Weakness [R53.1] Constipation [K59.00] Sepsis ([place]) [A41.9] Altered mental status, unspecified altered mental status type [R41.82] Pneumonia due to infectious organism, unspecified laterality, unspecified part of lung [J18.9] Patient Active Problem List   Diagnosis Date Noted   Weakness    Constipation 11/16/2020   Prolonged QT interval 11/16/2020   Aspiration pneumonia due to gastric secretions ([place])    Sepsis ([place]) 11/15/2020   CAP (community acquired pneumonia) 04/20/3566   Acute metabolic encephalopathy 01/41/0301   Essential  hypertension 11/15/2020   Asthma 11/15/2020   PCP:  [place]:   [id number] [place], [address] [place] [address] Phone: [phone] Fax: [phone]     Social Determinants of Health (SDOH) Interventions    Readmission Risk Interventions No flowsheet data  found.

## 2020-11-18 NOTE — Progress Notes (Signed)
[facility])  Referral received for outpatient palliative care services in the community.  Noted plan to dc today with home health services.  [facility] will follow up with patient in the home.  Venia Carbon RN, BSN, [facility] Liaison

## 2020-11-18 NOTE — Discharge Summary (Addendum)
[name] [place] NAME: Jasmine [name]    MR#:  [id number]  DATE OF BIRTH:  [birth year]  DATE OF ADMISSION:  11/15/2020 ADMITTING PHYSICIAN: Toy Baker, MD  DATE OF DISCHARGE: 11/18/2020  PRIMARY CARE PHYSICIAN: [facility] [place]    ADMISSION DIAGNOSIS:  Weakness [R53.1] Constipation [K59.00] Sepsis ([place]) [A41.9] Altered mental status, unspecified altered mental status type [R41.82] Pneumonia due to infectious organism, unspecified laterality, unspecified part of lung [J18.9]  DISCHARGE DIAGNOSIS:  acute metabolic encephalopathy secondary to possible aspiration pneumonia history of chronic dysphagia history of TBI status post craniotomy SECONDARY DIAGNOSIS:   Past Medical History:  Diagnosis Date  . Asthma 11/15/2020  . Hypertension     HOSPITAL COURSE:   Jasmine [name] is a 65 y.o. female with medical history significant of hyperlipidemia, TBI Presented with   fatigue and altered mental status.  History given by daughter.  Not acting like himself repeating sentences.  Generalized fatigue had a fall and could not get up.   Acute metabolic encephalopathy secondary to poor PO intake and possible aspiration pneumonia. -- patient has history of chronic dysphagia in the setting of traumatic brain injury and craniotomy in the past -- IV unasyn--change to oral in am -- she has been evaluated by speech therapist. Modified barium swallow to be done today prior to discharge. Continue diet instructions per speech therapy -- follow-up speech therapy recommendations. Daughter according to speech therapist is aware patient is at a high risk for aspiration. -- continue aspiration precautions -- PRN breathing treatments -- clinically patient does not have sepsis -- hemodynamically stable more alert awake today. Eating well. -- sats more than 92% on room air. No respiratory distress.   aspiration pneumonia with history  of chronic dysphagia --as above  --MBSS showed slient aspiration. Recommend puree with nectar thick liquids and medicine crushed in puree.  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 Discussed with Dr. Izora Ribas and recommended no acute intervention at present how were happy to follow-up as outpatient for possible replacing her bone flap back.   LOw Mag --replete orally   generalized weakness --PT OT to see patient--NO PT needs  discussed with patient's daughter Jasmine [name]. There are many medications patient is not taking. I have asked her daughter to follow-up with [facility] and gets her medications situated. For now will continue aspirin, Lipitor, antibiotics, stool softener vitamin C and trazodone   palliative care consultation to discuss goals of care given multiple comorbidities   Family communication :dter Jasmine [name] on the phone 6/17 Consults : palliative care CODE STATUS: full code DVT Prophylaxis : Lovenox Level of care: Med-Surg Status is: Inpatient   Remains inpatient appropriate because:Inpatient level of care appropriate due to severity of illness   Dispo: The patient is from: Home              Anticipated d/c is to:  home with [facility]              Patient currently is medically stable to d/c.              Difficult to place patient No    CONSULTS OBTAINED:    DRUG ALLERGIES:  No Known Allergies  DISCHARGE MEDICATIONS:   Allergies as of 11/18/2020   No Known Allergies  Medication List     STOP taking these medications    citalopram 20 MG tablet Commonly known as: CELEXA   gabapentin 300 MG capsule Commonly known as: NEURONTIN   melatonin 5 MG Tabs   oxybutynin 10 MG 24 hr tablet Commonly known as: DITROPAN-XL   tamsulosin 0.4 MG Caps capsule Commonly known as: FLOMAX    tazarotene 0.05 % cream Commonly known as: TAZORAC       TAKE these medications    amoxicillin-clavulanate 875-125 MG tablet Commonly known as: AUGMENTIN Take 1 tablet by mouth every 12 (twelve) hours for 5 days.   aspirin EC 81 MG tablet Take 81 mg by mouth daily.   atorvastatin 40 MG tablet Commonly known as: LIPITOR Take 40 mg by mouth daily.   docusate sodium 100 MG capsule Commonly known as: COLACE Take 100 mg by mouth 2 (two) times daily.   Fluticasone-Salmeterol 250-50 MCG/DOSE Aepb Commonly known as: ADVAIR Inhale 1 puff into the lungs every 12 (twelve) hours.   magnesium hydroxide 400 MG/5ML suspension Commonly known as: MILK OF MAGNESIA Take 5 mLs by mouth daily as needed for mild constipation.   polyethylene glycol 17 g packet Commonly known as: MIRALAX / GLYCOLAX Take 17 g by mouth daily.   traZODone 150 MG tablet Commonly known as: DESYREL Take 150 mg by mouth at bedtime.   vitamin C 500 MG tablet Commonly known as: ASCORBIC ACID Take 500 mg by mouth daily.        If you experience worsening of your admission symptoms, develop shortness of breath, life threatening emergency, suicidal or homicidal thoughts you must seek medical attention immediately by calling 911 or calling your MD immediately  if symptoms less severe.  You Must read complete instructions/literature along with all the possible adverse reactions/side effects for all the Medicines you take and that have been prescribed to you. Take any new Medicines after you have completely understood and accept all the possible adverse reactions/side effects.   Please note  You were cared for by a hospitalist during your hospital stay. If you have any questions about your discharge medications or the care you received while you were in the hospital after you are discharged, you can call the unit and asked to speak with the hospitalist on call if the hospitalist that took care of you is not  available. Once you are discharged, your primary care physician will handle any further medical issues. Please note that NO REFILLS for any discharge medications will be authorized once you are discharged, as it is imperative that you return to your primary care physician (or establish a relationship with a primary care physician if you do not have one) for your aftercare needs so that they can reassess your need for medications and monitor your lab values. Today   SUBJECTIVE   Overall doing well  Appears at baseline  VITAL SIGNS:  Blood pressure 96/70, pulse (!) 58, temperature 98.2 F (36.8 C), temperature source Oral, resp. rate 18, height 5\' 5"  (1.651 m), weight 50.8 kg, SpO2 96 %.  I/O:   Intake/Output Summary (Last 24 hours) at 11/18/2020 0946 Last data filed at 11/17/2020 1607 Gross per 24 hour  Intake 400.07 ml  Output 700 ml  Net -299.93 ml    PHYSICAL EXAMINATION:  GENERAL:  65 y.o.-year-old patient lying in the bed with no acute distress.  Chronic craniotomy changes LUNGS: Normal breath sounds bilaterally, no wheezing, rales,rhonchi or crepitation. No use of accessory muscles of respiration.  CARDIOVASCULAR: S1, S2 normal. No murmurs, rubs, or gallops.  ABDOMEN: Soft, non-tender, non-distended. Bowel sounds present. No organomegaly or mass.  EXTREMITIES: No pedal edema, cyanosis, or clubbing.  NEUROLOGIC: non focal, gen weakness  PSYCHIATRIC: The patient is alert and awake SKIN: No obvious rash, lesion, or ulcer.   DATA REVIEW:   CBC  Recent Labs  Lab 11/16/20 0445  WBC 12.9*  HGB 12.1  HCT 33.3*  PLT 194    Chemistries  Recent Labs  Lab 11/16/20 0620  NA 141  K 3.9  CL 107  CO2 25  GLUCOSE 109*  BUN 6*  CREATININE 0.74  CALCIUM 9.3  MG 1.7  AST 19  ALT 10  ALKPHOS 63  BILITOT 0.9    Microbiology Results   Recent Results (from the past 240 hour(s))  Culture, blood (Routine x 2)     Status: None (Preliminary result)   Collection Time:  11/15/20  7:26 PM   Specimen: BLOOD  Result Value Ref Range Status   Specimen Description BLOOD LEFT ANTECUBITAL  Final   Special Requests   Final    BOTTLES DRAWN AEROBIC AND ANAEROBIC Blood Culture adequate volume   Culture   Final    NO GROWTH 2 DAYS Performed at [facility], [address], [place], [place] [address]    Report Status PENDING  Incomplete  Culture, blood (Routine x 2)     Status: None (Preliminary result)   Collection Time: 11/15/20  7:43 PM   Specimen: BLOOD  Result Value Ref Range Status   Specimen Description BLOOD BLOOD LEFT FOREARM  Final   Special Requests   Final    BOTTLES DRAWN AEROBIC AND ANAEROBIC Blood Culture results may not be optimal due to an inadequate volume of blood received in culture bottles   Culture   Final    NO GROWTH 2 DAYS Performed at [facility], [address], [place], [place] [address]    Report Status PENDING  Incomplete  Resp Panel by RT-PCR (Flu A&B, Covid) Nasopharyngeal Swab     Status: None   Collection Time: 11/15/20  7:43 PM   Specimen: Nasopharyngeal Swab; Nasopharyngeal(NP) swabs in vial transport medium  Result Value Ref Range Status   SARS Coronavirus 2 by RT PCR NEGATIVE NEGATIVE Final    Comment: (NOTE) SARS-CoV-2 target nucleic acids are NOT DETECTED.  The SARS-CoV-2 RNA is generally detectable in upper respiratory specimens during the acute phase of infection. The lowest concentration of SARS-CoV-2 viral copies this assay can detect is 138 copies/mL. A negative result does not preclude SARS-Cov-2 infection and should not be used as the sole basis for treatment or other patient management decisions. A negative result may occur with  improper specimen collection/handling, submission of specimen other than nasopharyngeal swab, presence of viral mutation(s) within the areas targeted by this assay, and inadequate number of viral copies(<138 copies/mL). A negative result must be combined  with clinical observations, patient history, and epidemiological information. The expected result is Negative.  Fact Sheet for Patients:  EntrepreneurPulse.com.au  Fact Sheet for Healthcare Providers:  IncredibleEmployment.be  This test is no t yet approved or cleared by the [place] FDA and  has been authorized for detection and/or diagnosis of SARS-CoV-2 by FDA under an Emergency Use Authorization (EUA). This EUA will remain  in effect (meaning this test can be used) for the duration of the COVID-19 declaration under Section 564(b)(1) of the Act, 21 U.S.C.section 360bbb-3(b)(1), unless the authorization is terminated  or revoked sooner.       Influenza A by PCR NEGATIVE NEGATIVE Final   Influenza B by PCR NEGATIVE NEGATIVE Final    Comment: (NOTE) The Xpert Xpress SARS-CoV-2/FLU/RSV plus assay is intended as an aid in the diagnosis of influenza from Nasopharyngeal swab specimens and should not be used as a sole basis for treatment. Nasal washings and aspirates are unacceptable for Xpert Xpress SARS-CoV-2/FLU/RSV testing.  Fact Sheet for Patients: EntrepreneurPulse.com.au  Fact Sheet for Healthcare Providers: IncredibleEmployment.be  This test is not yet approved or cleared by the [place] FDA and has been authorized for detection and/or diagnosis of SARS-CoV-2 by FDA under an Emergency Use Authorization (EUA). This EUA will remain in effect (meaning this test can be used) for the duration of the COVID-19 declaration under Section 564(b)(1) of the Act, 21 U.S.C. section 360bbb-3(b)(1), unless the authorization is terminated or revoked.  Performed at [facility], [address] [place], [place] [address]     RADIOLOGY:  MR BRAIN WO CONTRAST  Addendum Date: 11/16/2020   ADDENDUM REPORT: 11/16/2020 12:04 ADDENDUM: Findings discussed with Dr. Posey Pronto via telephone at 11:56 a.m.  Electronically Signed   By: Margaretha Sheffield MD   On: 11/16/2020 12:04   Result Date: 11/16/2020 CLINICAL DATA:  Stroke follow-up. EXAM: MRI HEAD WITHOUT CONTRAST TECHNIQUE: Multiplanar, multiecho pulse sequences of the brain and surrounding structures were obtained without intravenous contrast. COMPARISON:  CT head November 15, 2020. FINDINGS: Brain: No acute infarct. Encephalomalacia in the right MCA territory, including the right basal ganglia, frontal, parietal and anterior temporal lobes with surrounding gliosis. Additional mild to moderate scattered T2/FLAIR hyperintensities within the white matter, compatible with chronic microvascular ischemic disease. Remote infarct in the left pons. Small remote right cerebellar lacunar infarct. Large right frontoparietal craniectomy with inward concavity of the skin flap. There is mild associated mass effect on the adjacent right cerebrum and mild (2 mm) associated leftward midline shift at the foramen of [place]. No acute hemorrhage, hydrocephalus, mass lesion or extra-axial fluid collection. Vascular: Major arterial flow voids are maintained at the skull base. Skull and upper cervical spine: Prior large right frontoparietal craniectomy with inward deformity of the dural flap. Otherwise, normal calvarial marrow signal. Sinuses/Orbits: Largely clear sinuses.  Unremarkable orbits. Other: No sizable mastoid effusions. IMPRESSION: 1. No evidence of acute infarct. 2. Encephalomalacia and gliosis in the right MCA territory, including the right basal ganglia, frontal, parietal and anterior temporal lobes. 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 3. Large right frontoparietal craniectomy with inward concavity of the skin flap, compatible with sinking skin flap syndrome. There is mild associated mass effect on the adjacent right cerebrum and mild (2 mm) leftward midline shift at the foramen of [place]. Correlation with outside priors may be useful (if available)  to assess the stability of this finding. 4. Additional mild to moderate chronic microvascular disease Electronically Signed: By: Margaretha Sheffield MD On: 11/16/2020 11:07     CODE STATUS:     Code Status Orders  (From admission, onward)           Start     Ordered   11/15/20 2245  Full code  Continuous        11/15/20 2244           Code Status History     This patient has a current code status but no historical code status.        TOTAL TIME TAKING CARE OF THIS PATIENT: 55  minutes.    Fritzi Mandes M.D  [facility]    CC: Primary care physician; [facility] [place]

## 2020-11-20 LAB — CULTURE, BLOOD (ROUTINE X 2)
Culture: NO GROWTH
Culture: NO GROWTH
Special Requests: ADEQUATE

## 2020-11-23 ENCOUNTER — Telehealth: Payer: Self-pay | Admitting: Primary Care

## 2020-11-23 NOTE — Telephone Encounter (Signed)
Spoke with patient's daughter, Alma Downs, regarding the Palliative referral/services and all questions were answered and she was in agreement with scheduling visit.  I have scheduled an In-home Consult for 12/01/20 @ 1 PM.

## 2020-12-01 ENCOUNTER — Other Ambulatory Visit: Payer: Self-pay

## 2020-12-01 ENCOUNTER — Other Ambulatory Visit: Payer: Medicare Other | Admitting: Primary Care

## 2020-12-01 DIAGNOSIS — Z5941 Food insecurity: Secondary | ICD-10-CM | POA: Insufficient documentation

## 2020-12-01 DIAGNOSIS — Z515 Encounter for palliative care: Secondary | ICD-10-CM

## 2020-12-01 DIAGNOSIS — J189 Pneumonia, unspecified organism: Secondary | ICD-10-CM

## 2020-12-01 DIAGNOSIS — K5901 Slow transit constipation: Secondary | ICD-10-CM

## 2020-12-01 DIAGNOSIS — G9341 Metabolic encephalopathy: Secondary | ICD-10-CM
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
## 2020-12-29 ENCOUNTER — Emergency Department
Admission: EM | Admit: 2020-12-29 | Discharge: 2021-02-02 | Disposition: A | Discharge status: Home / Self Care | Payer: Medicare Other | Attending: Emergency Medicine | Admitting: Emergency Medicine

## 2020-12-29 DIAGNOSIS — F02818 Dementia in other diseases classified elsewhere, unspecified severity, with other behavioral disturbance: Secondary | ICD-10-CM

## 2020-12-29 DIAGNOSIS — I10 Essential (primary) hypertension: Secondary | ICD-10-CM | POA: Insufficient documentation

## 2020-12-29 DIAGNOSIS — F0281 Dementia in other diseases classified elsewhere with behavioral disturbance: Secondary | ICD-10-CM

## 2020-12-29 DIAGNOSIS — Y900 Blood alcohol level of less than 20 mg/100 ml: Secondary | ICD-10-CM | POA: Insufficient documentation

## 2020-12-29 DIAGNOSIS — R4182 Altered mental status, unspecified: Secondary | ICD-10-CM | POA: Diagnosis not present

## 2020-12-29 DIAGNOSIS — S069X0S Unspecified intracranial injury without loss of consciousness, sequela: Secondary | ICD-10-CM

## 2020-12-29 DIAGNOSIS — Z79899 Other long term (current) drug therapy: Secondary | ICD-10-CM | POA: Insufficient documentation

## 2020-12-29 DIAGNOSIS — Z87820 Personal history of traumatic brain injury: Secondary | ICD-10-CM | POA: Diagnosis not present

## 2020-12-29 DIAGNOSIS — Z20822 Contact with and (suspected) exposure to covid-19: Secondary | ICD-10-CM | POA: Diagnosis not present

## 2020-12-29 DIAGNOSIS — J45909 Unspecified asthma, uncomplicated: Secondary | ICD-10-CM | POA: Insufficient documentation

## 2020-12-29 DIAGNOSIS — F17210 Nicotine dependence, cigarettes, uncomplicated: Secondary | ICD-10-CM | POA: Insufficient documentation

## 2020-12-29 DIAGNOSIS — R4689 Other symptoms and signs involving appearance and behavior: Secondary | ICD-10-CM

## 2020-12-29 DIAGNOSIS — R456 Violent behavior: Secondary | ICD-10-CM | POA: Insufficient documentation

## 2020-12-29 LAB — COMPREHENSIVE METABOLIC PANEL
ALT: 12 U/L (ref 0–44)
AST: 22 U/L (ref 15–41)
Albumin: 3.8 g/dL (ref 3.5–5.0)
Alkaline Phosphatase: 72 U/L (ref 38–126)
Anion gap: 8 (ref 5–15)
BUN: 5 mg/dL — ABNORMAL LOW (ref 8–23)
CO2: 25 mmol/L (ref 22–32)
Calcium: 10.5 mg/dL — ABNORMAL HIGH (ref 8.9–10.3)
Chloride: 111 mmol/L (ref 98–111)
Creatinine, Ser: 0.66 mg/dL (ref 0.44–1.00)
GFR, Estimated: >60 mL/min (ref >60)
Glucose, Bld: 119 mg/dL — ABNORMAL HIGH (ref 70–99)
Potassium: 2.9 mmol/L — ABNORMAL LOW (ref 3.5–5.1)
Sodium: 144 mmol/L (ref 135–145)
Total Bilirubin: 0.6 mg/dL (ref 0.3–1.2)
Total Protein: 7.7 g/dL (ref 6.5–8.1)

## 2020-12-29 LAB — CBC
HCT: 38.9 % (ref 36.0–46.0)
Hemoglobin: 14.1 g/dL (ref 12.0–15.0)
MCH: 27.5 pg (ref 26.0–34.0)
MCHC: 36.2 g/dL — ABNORMAL HIGH (ref 30.0–36.0)
MCV: 76 fL — ABNORMAL LOW (ref 80.0–100.0)
Platelets: 203 10*3/uL (ref 150–400)
RBC: 5.12 MIL/uL — ABNORMAL HIGH (ref 3.87–5.11)
RDW: 16.3 % — ABNORMAL HIGH (ref 11.5–15.5)
WBC: 4.9 10*3/uL (ref 4.0–10.5)
nRBC: 0 % (ref 0.0–0.2)

## 2020-12-29 LAB — RESP PANEL BY RT-PCR (FLU A&B, COVID) ARPGX2
Influenza A by PCR: NEGATIVE
Influenza B by PCR: NEGATIVE
SARS Coronavirus 2 by RT PCR: NEGATIVE

## 2020-12-29 LAB — ETHANOL: Alcohol, Ethyl (B): <10 mg/dL (ref <10)

## 2020-12-29 MED ORDER — POTASSIUM CHLORIDE CRYS ER 20 MEQ PO TBCR
40.0000 meq | EXTENDED_RELEASE_TABLET | Freq: Once | ORAL | Status: AC
  Administered 2020-12-29: 40 meq via ORAL
  Filled 2020-12-29: qty 2

## 2020-12-29 NOTE — ED Notes (Signed)
Patient belongings removed from patient:  White tshirt  Grey leggings Purple underwear Black socks Black shoes  Lighter Black cane

## 2020-12-29 NOTE — ED Notes (Signed)
Social Worker: Pleas Koch [phone] Patient sister: Tsurue Wargel [phone]

## 2020-12-29 NOTE — ED Notes (Signed)
Pt sleeping with covers over head
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
## 2020-12-29 NOTE — ED Notes (Signed)
IVC, pending consult

## 2020-12-29 NOTE — ED Notes (Signed)
Pt sleeping with covers over her head.
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
## 2020-12-29 NOTE — ED Notes (Signed)
Pt ambulated to bathroom without assistance.  Returned to bed

## 2020-12-29 NOTE — ED Notes (Signed)
Pt requesting and given sprite.

## 2020-12-29 NOTE — ED Notes (Signed)
IVC, pending AM reassessment for possible SW placement

## 2020-12-30 DIAGNOSIS — R456 Violent behavior: Secondary | ICD-10-CM | POA: Diagnosis not present

## 2020-12-30 LAB — URINALYSIS, COMPLETE (UACMP) WITH MICROSCOPIC
Bacteria, UA: NONE SEEN
Bilirubin Urine: NEGATIVE
Glucose, UA: NEGATIVE mg/dL
Hgb urine dipstick: NEGATIVE
Ketones, ur: NEGATIVE mg/dL
Leukocytes,Ua: NEGATIVE
Nitrite: NEGATIVE
Protein, ur: NEGATIVE mg/dL
Specific Gravity, Urine: 1.01 (ref 1.005–1.030)
pH: 6 (ref 5.0–8.0)

## 2020-12-30 LAB — URINE DRUG SCREEN, QUALITATIVE (ARMC ONLY)
Amphetamines, Ur Screen: NOT DETECTED
Barbiturates, Ur Screen: NOT DETECTED
Benzodiazepine, Ur Scrn: NOT DETECTED
Cannabinoid 50 Ng, Ur ~~LOC~~: NOT DETECTED
Cocaine Metabolite,Ur ~~LOC~~: NOT DETECTED
MDMA (Ecstasy)Ur Screen: NOT DETECTED
Methadone Scn, Ur: NOT DETECTED
Opiate, Ur Screen: NOT DETECTED
Phencyclidine (PCP) Ur S: NOT DETECTED
Tricyclic, Ur Screen: NOT DETECTED

## 2020-12-30 MED ORDER — ATORVASTATIN CALCIUM 20 MG PO TABS
40.0000 mg | ORAL_TABLET | Freq: Every day | ORAL | Status: DC
  Administered 2020-12-30 – 2021-02-02 (×33): 40 mg via ORAL
  Filled 2020-12-30 (×33): qty 2

## 2020-12-30 NOTE — ED Notes (Signed)
Pt ambulatory to restroom with walker 
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
## 2020-12-30 NOTE — ED Notes (Signed)
Papers rescinded pending SW placement

## 2020-12-30 NOTE — Consult Note (Signed)
Client psychiatrically cleared by Dr Weber Cooks.  This provided assessed her today and she has remained calm and cooperative with no threats to self or others.  She denies depression, anxiety, and other mental health needs.  Evidently, she got upset yesterday and threatened others.  IVC rescinded.  Caveat:  The client has a severe TBI, effecting her impulse control and decision making capabilities.  Waylan Boga, PMHNP

## 2020-12-30 NOTE — ED Notes (Signed)
Pt resting, dinner tray placed beside bed

## 2020-12-30 NOTE — ED Notes (Signed)
Breakfast tray given. No other needs found at this moment.

## 2020-12-30 NOTE — ED Notes (Signed)
Gave Pt 2 blankets.

## 2020-12-30 NOTE — ED Notes (Signed)
Pt resting quietly with covers over her head, respirations equal and unlabored, will continue to monitor.

## 2020-12-30 NOTE — ED Notes (Signed)
Pt ambulatory to restroom with walker. Urine sample collected

## 2020-12-30 NOTE — ED Notes (Signed)
Orange juice given. Shower offered. Pt refused at this moment.

## 2020-12-30 NOTE — ED Notes (Signed)
Pt given lunch tray and juice.

## 2020-12-30 NOTE — ED Notes (Signed)
Walker given to patient as well as something to drink. Denies any further needs at this time.

## 2020-12-31 DIAGNOSIS — R456 Violent behavior: Secondary | ICD-10-CM | POA: Diagnosis not present

## 2020-12-31 MED ORDER — TRAZODONE HCL 50 MG PO TABS
150.0000 mg | ORAL_TABLET | Freq: Every day | ORAL | Status: DC
  Administered 2020-12-31 – 2021-02-01 (×31): 150 mg via ORAL
  Filled 2020-12-31 (×31): qty 1

## 2020-12-31 MED ORDER — MOMETASONE FURO-FORMOTEROL FUM 200-5 MCG/ACT IN AERO
2.0000 | INHALATION_SPRAY | Freq: Two times a day (BID) | RESPIRATORY_TRACT | Status: DC
  Administered 2021-01-02 – 2021-02-02 (×37): 2 via RESPIRATORY_TRACT
  Filled 2020-12-31 (×4): qty 8.8

## 2020-12-31 MED ORDER — MELATONIN 5 MG PO TABS
5.0000 mg | ORAL_TABLET | Freq: Every evening | ORAL | Status: DC | PRN
  Administered 2021-01-09 – 2021-01-25 (×7): 5 mg via ORAL
  Filled 2020-12-31 (×8): qty 1

## 2020-12-31 MED ORDER — SENNA 8.6 MG PO TABS
2.0000 | ORAL_TABLET | Freq: Every day | ORAL | Status: DC
  Administered 2020-12-31 – 2021-02-02 (×32): 17.2 mg via ORAL
  Filled 2020-12-31 (×32): qty 2

## 2020-12-31 NOTE — ED Provider Notes (Signed)
Emergency Medicine Observation Re-evaluation Note  Jasmine [name] is a 65 y.o. female, seen on rounds today.  Pt initially presented to the ED for complaints of Aggressive Behavior Currently, the patient is resting comfortably without complaints.  Physical Exam  BP 104/63 (BP Location: Left Arm)   Pulse (!) 50   Temp 99.3 F (37.4 C) (Oral)   Resp 18   Ht '5\' 5"'$  (1.651 m)   Wt 50 kg   SpO2 98%   BMI 18.34 kg/m  Physical Exam Gen: No acute distress  Resp: Normal rise and fall of chest Neuro: Moving all four extremities Psych: Resting currently, calm and cooperative when awake    ED Course / MDM  EKG:EKG Interpretation  Date/Time:  Thursday December 29 2020 17:23:52 EDT Ventricular Rate:  81 PR Interval:  142 QRS Duration: 70 QT Interval:  364 QTC Calculation: 422 R Axis:   6 Text Interpretation: Sinus rhythm with Fusion complexes Otherwise normal ECG Confirmed by UNCONFIRMED, DOCTOR ([id number]), editor Mel Almond, Tammy [phone]) on 12/30/2020 9:39:52 AM  I have reviewed the labs performed to date as well as medications administered while in observation.  Recent changes in the last 24 hours include no acute events overnight.  Plan  Current plan is for social work placement.  Jasmine [name] is not under involuntary commitment.     Jasmine [name], Delice Bison, DO 12/31/20 [phone]

## 2020-12-31 NOTE — ED Notes (Signed)
Pt given lunch tray.

## 2020-12-31 NOTE — ED Notes (Signed)
VOL/pending SW placement

## 2020-12-31 NOTE — ED Notes (Signed)
Pt up to restroom using walker, no assistance required.

## 2020-12-31 NOTE — ED Notes (Signed)
Pt telling NT that she needs something for her BM. Creig Hines MD made aware that pt is requesting a stool softener at this time.

## 2021-01-01 DIAGNOSIS — R456 Violent behavior: Secondary | ICD-10-CM | POA: Diagnosis not present

## 2021-01-01 NOTE — ED Notes (Signed)
Pt declines to take trazodone at this time, will hold administation and attempt to administer againin about 1-2 hours.

## 2021-01-01 NOTE — ED Notes (Signed)
Po fluids provided.  

## 2021-01-01 NOTE — TOC Progression Note (Signed)
Transition of Care [facility]) - Progression Note    Patient Details  Name: Jasmine [name] MRN: [id number] Date of Birth: [birth year]  Transition of Care [facility]) CM/SW [place], [place] Phone Number: 01/01/2021, 1:01 PM  Clinical Narrative:     PT/OT has been consulted for evaluation, pending placement. Patient is currently followed by [facility] DSS/APS [phone], [phone], does not have a protective order.  Patient's daughter Jasmine [name], Jasmine [name] (Daughter) [phone].   Barriers to Discharge: Homeless with medical needs, Inadequate or no insurance, Family Issues, ED Uninsured needing placement-LOG  Expected Discharge Plan and Services                                                 Social Determinants of Health (SDOH) Interventions    Readmission Risk Interventions No flowsheet data found.

## 2021-01-01 NOTE — ED Notes (Signed)
Sheets changed.

## 2021-01-01 NOTE — ED Provider Notes (Signed)
-----------------------------------------   5:27 AM on 01/01/2021 -----------------------------------------   Blood pressure 98/71, pulse (!) 59, temperature 99.3 F (37.4 C), temperature source Oral, resp. rate 16, height '5\' 5"'$  (1.651 m), weight 50 kg, SpO2 99 %.  The patient is calm and cooperative at this time.  There have been no acute events since the last update.  Awaiting disposition plan from Social Work team.   Paulette Blanch, MD 01/01/21 [phone]

## 2021-01-02 ENCOUNTER — Other Ambulatory Visit: Payer: Medicare Other | Admitting: Primary Care

## 2021-01-02 ENCOUNTER — Other Ambulatory Visit: Payer: Medicare Other

## 2021-01-02 DIAGNOSIS — R456 Violent behavior: Secondary | ICD-10-CM | POA: Diagnosis not present

## 2021-01-02 NOTE — ED Notes (Signed)
Pt awoken for vital signs. Pt up to bedside commode with walker to attempt to have bowel movement after vital signs.

## 2021-01-02 NOTE — ED Notes (Signed)
Given dinner

## 2021-01-02 NOTE — ED Notes (Addendum)
OT at bedside. 

## 2021-01-02 NOTE — ED Notes (Signed)
Breakfast placed at side.

## 2021-01-02 NOTE — TOC Progression Note (Signed)
Transition of Care [facility]) - Progression Note    Patient Details  Name: Jasmine [name] MRN: [id number] Date of Birth: 31-Jul-1955  Transition of Care [facility]) CM/SW [place], RN Phone Number: 01/02/2021, 2:55 PM  Clinical Narrative:  Spoke with DSS, Queen Slough who requests that patient keep her scheduled appointment at the [facility] clinic on tomorrow at 10:30am, her sister Akea Sivertsen [phone] will transport patient and bring her back to the hospital. I did inform Ms. Queen Slough that I will consult with my Manager and call her back. Spoke with my Manager, Andreas Newport, who informed me that if patient leaves she will be considered a discharge. Called Ms. Bullock back and informed her that we could not honor the appointment, unless patient is discharged, Ms. Queen Slough voices understanding, says she will come see patient on Wednesday for evaluation. Ms. Queen Slough informed that patient continues to refuse PT, which is required for SNF placements., Ms. Queen Slough says she will discuss this with patient on Wednesday. TOC to continue to track for discharge needs.       Barriers to Discharge: Homeless with medical needs, Inadequate or no insurance, Family Issues, ED Uninsured needing placement-LOG  Expected Discharge Plan and Services                                                 Social Determinants of Health (SDOH) Interventions    Readmission Risk Interventions No flowsheet data found.

## 2021-01-02 NOTE — Progress Notes (Signed)
PT Cancellation Note  Patient Details Name: Jasmine [name] MRN: [id number] DOB: 30-Oct-1955   Cancelled Treatment:    Reason Eval/Treat Not Completed: Per nursing patient again declined to participate with PT services this afternoon.  Will attempt to see pt at a future date/time as medically appropriate.      DNicki Reaper Peggyann Zwiefelhofer PT, DPT 01/02/21, 1:32 PM

## 2021-01-02 NOTE — Progress Notes (Signed)
PT Cancellation Note  Patient Details Name: Jasmine [name] MRN: [id number] DOB: 22-Aug-1955   Cancelled Treatment:    Reason Eval/Treat Not Completed: Patient declined to participate with PT services despite multiple attempts and encouragement.  Will attempt to see pt at a future date/time as medically appropriate.     Linus Salmons PT, DPT 01/02/21, 10:13 AM

## 2021-01-02 NOTE — ED Provider Notes (Signed)
-----------------------------------------   5:09 AM on 01/02/2021 -----------------------------------------   Blood pressure 100/79, pulse 68, temperature 98.5 F (36.9 C), temperature source Oral, resp. rate 15, height '5\' 5"'$  (1.651 m), weight 50 kg, SpO2 96 %.  The patient is calm and cooperative at this time.  There have been no acute events since the last update.  Awaiting disposition plan from Social Work team.   Paulette Blanch, MD 01/02/21 [phone]

## 2021-01-02 NOTE — Progress Notes (Signed)
[facility]) Hospital Liaison note:  This patient is currently enrolled in [facility] outpatient-based Palliative Care. Will continue to follow for disposition.  Please call with any outpatient palliative questions or concerns.  Thank you, Lorelee Market, LPN [facility] Liaison [phone]

## 2021-01-02 NOTE — ED Notes (Signed)
Report to ally, rn.  

## 2021-01-02 NOTE — Evaluation (Signed)
Occupational Therapy Evaluation Patient Details Name: Jasmine [name] MRN: [id number] DOB: [birth year] Today's Date: 01/02/2021    History of Present Illness Jasmine [name] is a 65 y.o. female, seen on rounds today.  Pt initially presented to the ED for complaints of Aggressive Behavior. PMH signifiant for TBI.   Clinical Impression   Patient presenting with decreased I in self care, balance, functional mobility, transfers, endurance, and safety awareness. Patient oriented to self and location. She reports living with daughter but per chart and staff reports pt is now homeless. Pt reports being I with self care tasks and use of SPC for mobility. She is overall pleasant and cooperative initially. She dons B socks while sitting up in bed and washes her face with set up A. Increased time to complete tasks and sequence. She refuses OOB activities. Staff report she has been going to bathroom with assistance and use of RW.  Patient will benefit from acute OT to increase overall independence in the areas of ADLs, functional mobility, and safety awareness in order to safely discharge to next venue of care.     Follow Up Recommendations  SNF;Supervision/Assistance - 24 hour    Equipment Recommendations  Other (comment) (defer to next venue of care)       Precautions / Restrictions Precautions Precautions: Fall      Mobility Bed Mobility Overal bed mobility: Modified Independent             General bed mobility comments: no assistance needed    Transfers                 General transfer comment: pt refusal        ADL either performed or assessed with clinical judgement   ADL Overall ADL's : Needs assistance/impaired     Grooming: Wash/dry hands;Wash/dry face;Set up;Bed level               Lower Body Dressing: Set up;Supervision/safety;Bed level                 General ADL Comments: Pt refused OOB transfer. She donned B socks and performed grooming in bed with  set up A and cuing to perform task. Pt needing increased time to process but able to do without assistance from therapist. Staff report Min A for toilet transfer.     Vision Patient Visual Report: No change from baseline              Pertinent Vitals/Pain Pain Assessment: No/denies pain     Hand Dominance Left   Extremity/Trunk Assessment Upper Extremity Assessment Upper Extremity Assessment: Overall WFL for tasks assessed;Generalized weakness   Lower Extremity Assessment Lower Extremity Assessment: Overall WFL for tasks assessed;Generalized weakness       Communication Communication Communication: No difficulties   Cognition Arousal/Alertness: Awake/alert Behavior During Therapy: Flat affect Overall Cognitive Status: History of cognitive impairments - at baseline                                 General Comments: Pt was overall cooperative but did not want to get out of bed. She was not agitated during session.              Home Living Family/patient expects to be discharged to:: Shelter/Homeless  Additional Comments: Pt reports living with daughter but per chart review and staff report she is currently homeless.      Prior Functioning/Environment          Comments: Pt reports she is able to care for herself and uses [facility] for ambulation. No family present to confirm baseline.        OT Problem List: Decreased strength;Decreased activity tolerance;Impaired balance (sitting and/or standing);Decreased safety awareness;Cardiopulmonary status limiting activity;Decreased knowledge of use of DME or AE;Decreased cognition      OT Treatment/Interventions: Self-care/ADL training;Manual therapy;Visual/perceptual remediation/compensation;Therapeutic exercise;Patient/family education;Neuromuscular education;Balance training;Energy conservation;Therapeutic activities;DME and/or AE instruction;Cognitive  remediation/compensation    OT Goals(Current goals can be found in the care plan section) Acute Rehab OT Goals Patient Stated Goal: "to get rest and breakfast" OT Goal Formulation: With patient Time For Goal Achievement: 01/16/21 Potential to Achieve Goals: Good ADL Goals Pt Will Perform Grooming: with supervision;standing Pt Will Transfer to Toilet: with supervision;ambulating Pt Will Perform Toileting - Clothing Manipulation and hygiene: with supervision;sit to/from stand Pt/caregiver will Perform Home Exercise Program: Increased strength;Both right and left upper extremity;With written HEP provided;With Supervision;With theraband  OT Frequency: Min 1X/week   Barriers to D/C: Decreased caregiver support  pt is homeless with medical needs          AM-PAC OT "6 Clicks" Daily Activity     Outcome Measure Help from another person eating meals?: None Help from another person taking care of personal grooming?: A Little Help from another person toileting, which includes using toliet, bedpan, or urinal?: A Little Help from another person bathing (including washing, rinsing, drying)?: A Little Help from another person to put on and taking off regular upper body clothing?: None Help from another person to put on and taking off regular lower body clothing?: A Little 6 Click Score: 20   End of Session Equipment Utilized During Treatment: Rolling walker Nurse Communication: Mobility status;Precautions  Activity Tolerance: Patient limited by fatigue Patient left: in bed;with call bell/phone within reach;with bed alarm set  OT Visit Diagnosis: Unsteadiness on feet (R26.81);Repeated falls (R29.6);Muscle weakness (generalized) (M62.81)                Time: [id number] OT Time Calculation (min): 19 min Charges:  OT General Charges $OT Visit: 1 Visit OT Evaluation $OT Eval Low Complexity: 1 Low OT Treatments $Self Care/Home Management : 8-22 mins  Darleen Crocker, MS, OTR/L , CBIS ascom  [phone]  01/02/21, 1:12 PM

## 2021-01-03 DIAGNOSIS — R456 Violent behavior: Secondary | ICD-10-CM | POA: Diagnosis not present

## 2021-01-03 NOTE — Progress Notes (Signed)
PT Cancellation Note  Patient Details Name: Jasmine [name] MRN: [id number] DOB: 1955-12-07   Cancelled Treatment:    Reason Eval/Treat Not Completed: Per nursing patient declined to participate with PT services this date. Will attempt to see pt at a future date/time as medically appropriate.     Linus Salmons PT, DPT 01/03/21, 2:18 PM

## 2021-01-03 NOTE — ED Notes (Signed)
Pt given lunch tray.

## 2021-01-03 NOTE — ED Notes (Signed)
Pt awake asking for drink at this itme

## 2021-01-03 NOTE — ED Notes (Signed)
Pt given a cup of coke per request.

## 2021-01-03 NOTE — ED Notes (Signed)
Gave breakfast tray with juice.

## 2021-01-03 NOTE — ED Notes (Addendum)
Pt refusing PT at this time. Pt states, "I am too cold. I am trying to warm up"

## 2021-01-04 DIAGNOSIS — R456 Violent behavior: Secondary | ICD-10-CM | POA: Diagnosis not present

## 2021-01-04 NOTE — ED Notes (Signed)
Hourly rounding reveals patient in room. No complaints, stable, in no acute distress. Q15 minute rounds and monitoring via Security Cameras to continue. 

## 2021-01-04 NOTE — Evaluation (Signed)
Physical Therapy Evaluation Patient Details Name: Jasmine [name] MRN: [id number] DOB: [birth year] Today's Date: 01/04/2021   History of Present Illness  KAYLENN PITTINGER is a 65 y.o. female who initially presented to the ED for complaints of Aggressive Behavior. PMH signifiant for TBI.  Clinical Impression  Pt allowed therapist to assess walking from restroom back to room but unwilling to participate in further activity reporting she is too cold and getting back in bed. Per nurse, pt is mod I with bed mobility, transfers and ambulates mod I with RW from room to restroom within the ED. Pt demonstrated unsteadiness walking with quad cane trying to grab the walls and drifting left/right. Pt demonstrated increased steadiness and increased speed walking with RW with no LOB. PT services will be discontinued secondary to unwillingness to participate during this session as well as multiple previous attempts. Will however recommend pt obtains a RW before d/c.   Will complete PT orders at this time but will reassess pt pending a change in status upon receipt of new PT orders.      Follow Up Recommendations No PT follow up    Equipment Recommendations  Rolling walker with 5" wheels    Recommendations for Other Services       Precautions / Restrictions Precautions Precautions: Fall Restrictions Weight Bearing Restrictions: No      Mobility  Bed Mobility Overal bed mobility: Modified Independent                  Transfers Overall transfer level: Modified independent Equipment used: Rolling walker (2 wheeled)             General transfer comment: Stand>sit with good eccentric/concentric control to non-elevated surface.  Ambulation/Gait Ambulation/Gait assistance: Supervision;Min guard Gait Distance (Feet): 20 Feet x1(with RW); 25f x1(with QC) Assistive device: Rolling walker (2 wheeled);Quad cane Gait Pattern/deviations: Step-through pattern;Decreased stride length;Drifts  right/left Gait velocity: decreased   General Gait Details: Pt used RW primarily and attempted with quad cane for 551f Pt less steadiness with quad cane compared to RW exhibiting drifting L/R and reaching out to grab the walls.  Stairs            Wheelchair Mobility    Modified Rankin (Stroke Patients Only)       Balance Overall balance assessment: Modified Independent Sitting-balance support: No upper extremity supported;Feet supported Sitting balance-Leahy Scale: Normal     Standing balance support: Bilateral upper extremity supported;Single extremity supported Standing balance-Leahy Scale: Fair Standing balance comment: Reliance on BUE support through RW. Unsteady with single UE support.                             Pertinent Vitals/Pain Pain Assessment: No/denies pain    Home Living Family/patient expects to be discharged to:: Shelter/Homeless                 Additional Comments: Pt reports living with daughter but per chart review and staff report she is currently homeless.    Prior Function Level of Independence: Independent with assistive device(s)         Comments: Pt reports ambulating with quad cane at baseline and is mod ind with ADLs. No family present to confirm baseline.     Hand Dominance   Dominant Hand: Left    Extremity/Trunk Assessment   Upper Extremity Assessment Upper Extremity Assessment: Generalized weakness;Difficult to assess due to impaired cognition    Lower Extremity Assessment Lower  Extremity Assessment: Generalized weakness;Difficult to assess due to impaired cognition       Communication   Communication: No difficulties  Cognition Arousal/Alertness: Awake/alert Behavior During Therapy: Flat affect Overall Cognitive Status: No family/caregiver present to determine baseline cognitive functioning                                 General Comments: Pt walked to back to room after using  restroom but refused to do any other activity.      General Comments      Exercises     Assessment/Plan    PT Assessment Patent does not need any further PT services  PT Problem List Decreased activity tolerance;Decreased balance;Decreased mobility;Decreased knowledge of precautions;Decreased safety awareness       PT Treatment Interventions      PT Goals (Current goals can be found in the Care Plan section)       Frequency     Barriers to discharge Inaccessible home environment Pt is currently homless    Co-evaluation               AM-PAC PT "6 Clicks" Mobility  Outcome Measure Help needed turning from your back to your side while in a flat bed without using bedrails?: None Help needed moving from lying on your back to sitting on the side of a flat bed without using bedrails?: None Help needed moving to and from a bed to a chair (including a wheelchair)?: None Help needed standing up from a chair using your arms (e.g., wheelchair or bedside chair)?: None Help needed to walk in hospital room?: A Little Help needed climbing 3-5 steps with a railing? : A Little 6 Click Score: 22    End of Session Equipment Utilized During Treatment: Other (comment) (Nurse reports that pt has been walking mod I within ED from room to bathroom) Activity Tolerance: Other (comment) (Treatment limited by refusal to participate) Patient left: in bed Nurse Communication: Mobility status PT Visit Diagnosis: Unsteadiness on feet (R26.81);Difficulty in walking, not elsewhere classified (R26.2)    Time: [id number] PT Time Calculation (min) (ACUTE ONLY): 17 min   Charges:              Dayton Scrape SPT 01/04/21, 1:25 PM

## 2021-01-04 NOTE — ED Notes (Signed)
Vol pending placment

## 2021-01-04 NOTE — ED Notes (Signed)
Report to hewan, rn 

## 2021-01-04 NOTE — ED Notes (Signed)
Social services here to speak with pt.

## 2021-01-04 NOTE — ED Notes (Signed)
Patient denies pain and is resting comfortably.  

## 2021-01-04 NOTE — ED Notes (Signed)
Pt up to restroom to void.  

## 2021-01-04 NOTE — ED Notes (Signed)
Patient resting at this time.

## 2021-01-04 NOTE — ED Notes (Signed)
Report to include Situation, Background, Assessment, and Recommendations received from April RN. Patient alert and oriented, warm and dry, in no acute distress. Patient denies SI, HI, AVH and pain. Patient made aware of Q15 minute rounds and Engineer, drilling presence for their safety. Patient instructed to come to me with needs or concerns.

## 2021-01-04 NOTE — Progress Notes (Signed)
Occupational Therapy Treatment Patient Details Name: Jasmine [name] MRN: [id number] DOB: [birth year] Today's Date: 01/04/2021    History of present illness Jasmine [name] is a 65 y.o. female who initially presented to the ED for complaints of Aggressive Behavior. PMH signifiant for TBI.   OT comments  [name] was seen for OT treatment on this date. Upon arrival to room pt standing in room with DSS SW present, pt requesting to use bathroom. Pt completed toilet t/f with MOD I using RW - no LOBs and good eccentric control to low commode height. MOD I + RW for perihygiene and hand washing standing. Pt is difficult to redirect however fair safety awareness t/o. Pt returned to room and does not engage in attempted conversation for OT education. Pt demonstrates near baseline for ADLs. No skilled OT needs identified. Will sign off. Please re-consult if additional OT needs arise.    Follow Up Recommendations  No OT follow up;Supervision - Intermittent    Equipment Recommendations  None recommended by OT    Recommendations for Other Services      Precautions / Restrictions Precautions Precautions: Fall Restrictions Weight Bearing Restrictions: No       Mobility Bed Mobility Overal bed mobility: Modified Independent                  Transfers Overall transfer level: Modified independent Equipment used: Rolling walker (2 wheeled)             General transfer comment: good eccentric control from low toilet height    Balance Overall balance assessment: Modified Independent Sitting-balance support: No upper extremity supported;Feet supported Sitting balance-Leahy Scale: Normal     Standing balance support: Bilateral upper extremity supported;Single extremity supported Standing balance-Leahy Scale: Fair Standing balance comment: Reliance on BUE support through RW. Unsteady with single UE support.                           ADL either performed or assessed with  clinical judgement   ADL Overall ADL's : At baseline                                       General ADL Comments: MOD I using RW for toilet t/f, perihygiene, and hand washing standing sinkside. Pt is difficult to redirect however fair safety awareness.      Cognition Arousal/Alertness: Awake/alert Behavior During Therapy: Flat affect Overall Cognitive Status: No family/caregiver present to determine baseline cognitive functioning                                 General Comments: difficult to redirect        Exercises Exercises: Other exercises Other Exercises Other Exercises: Pt educated re: OT role, DME recs, d/c recs, falls prevention Other Exercises: LBD, grooming, toileting, sit<>stnd x2, sitting/standing balance/tolerance           Pertinent Vitals/ Pain       Pain Assessment: No/denies pain  Home Living Family/patient expects to be discharged to:: Shelter/Homeless                                 Additional Comments: Pt reports living with daughter but per chart review and staff report she is currently homeless.  Prior Functioning/Environment Level of Independence: Independent with assistive device(s)        Comments: Pt reports ambulating with quad cane at baseline and is mod ind with ADLs. No family present to confirm baseline.   Frequency  Min 1X/week        Progress Toward Goals  OT Goals(current goals can now be found in the care plan section)  Progress towards OT goals: Goals met/education completed, patient discharged from OT  Acute Rehab OT Goals Patient Stated Goal: to have a bowel movement OT Goal Formulation: With patient Time For Goal Achievement: 01/16/21 Potential to Achieve Goals: Good ADL Goals Pt Will Perform Grooming: with supervision;standing Pt Will Transfer to Toilet: with supervision;ambulating Pt Will Perform Toileting - Clothing Manipulation and hygiene: with supervision;sit  to/from stand Pt/caregiver will Perform Home Exercise Program: Increased strength;Both right and left upper extremity;With written HEP provided;With Supervision;With theraband  Plan All goals met and education completed, patient discharged from OT services       AM-PAC OT "6 Clicks" Daily Activity     Outcome Measure   Help from another person eating meals?: None Help from another person taking care of personal grooming?: None Help from another person toileting, which includes using toliet, bedpan, or urinal?: None Help from another person bathing (including washing, rinsing, drying)?: None Help from another person to put on and taking off regular upper body clothing?: None Help from another person to put on and taking off regular lower body clothing?: None 6 Click Score: 24    End of Session Equipment Utilized During Treatment: Rolling walker  OT Visit Diagnosis: Unsteadiness on feet (R26.81);Repeated falls (R29.6);Muscle weakness (generalized) (M62.81)   Activity Tolerance Patient tolerated treatment well   Patient Left in chair;with call bell/phone within reach;with nursing/sitter in room (on [facility] with DSS SW in room and NT)   Nurse Communication          Time: 8882-8003 OT Time Calculation (min): 10 min  Charges: OT General Charges $OT Visit: 1 Visit OT Treatments $Self Care/Home Management : 8-22 mins  Dessie Coma, M.S. OTR/L  01/04/21, 2:13 PM  ascom [phone]

## 2021-01-04 NOTE — TOC Progression Note (Signed)
Transition of Care [facility]) - Progression Note    Patient Details  Name: RESHUNDA URREGO MRN: [id number] Date of Birth: [birth year]  Transition of Care [facility]) CM/SW [place], RN Phone Number: 01/04/2021, 2:36 PM  Clinical Narrative:  DSS/APS Pleas Koch was here to assess patient, advised TOC to complete FL2 and email to her for placement arrangements.       Barriers to Discharge: Homeless with medical needs, Inadequate or no insurance, Family Issues, ED Uninsured needing placement-LOG  Expected Discharge Plan and Services                                                 Social Determinants of Health (SDOH) Interventions    Readmission Risk Interventions No flowsheet data found.

## 2021-01-04 NOTE — ED Notes (Signed)
Pt up to restroom.

## 2021-01-04 NOTE — ED Notes (Signed)
Pt declines oral hygeine and shower this morning.

## 2021-01-04 NOTE — ED Notes (Signed)
Hourly rounding reveals patient in room. No complaints, stable, in no acute distress. Q15 minute rounds and monitoring via Rover and Officer to continue.   

## 2021-01-05 DIAGNOSIS — R456 Violent behavior: Secondary | ICD-10-CM | POA: Diagnosis not present

## 2021-01-05 NOTE — ED Notes (Signed)
Hourly rounding reveals patient in room. No complaints, stable, in no acute distress. Q15 minute rounds and monitoring via Rover and Officer to continue.   

## 2021-01-05 NOTE — ED Notes (Signed)
Pt voided in bedside commode

## 2021-01-05 NOTE — ED Notes (Signed)
Report received from Ally, RN including SBAR. Patient alert and oriented, warm and dry, and in no acute distress. Patient denies SI, HI, AVH and pain. Patient made aware of Q15 minute rounds and Rover and Officer presence for their safety. Patient instructed to come to this nurse with needs or concerns.  

## 2021-01-05 NOTE — ED Notes (Signed)
Offered shower, states she is unsure at this time.

## 2021-01-05 NOTE — ED Notes (Signed)
Pt provided with drink at this time, to bedside commode via walker on own

## 2021-01-05 NOTE — ED Notes (Signed)
Pt given dinner tray.

## 2021-01-05 NOTE — TOC Progression Note (Signed)
Transition of Care [facility]) - Progression Note    Patient Details  Name: Jasmine [name] MRN: [id number] Date of Birth: 1956/03/17  Transition of Care [facility]) CM/SW [place], RN Phone Number: 01/05/2021, 12:40 PM  Clinical Narrative: Damaris Schooner with DSS/APS Pleas Koch about placement, [facility] faxed, she will forward to SW, will keep TOC posted.        Barriers to Discharge: Homeless with medical needs, Inadequate or no insurance, Family Issues, ED Uninsured needing placement-LOG  Expected Discharge Plan and Services                                                 Social Determinants of Health (SDOH) Interventions    Readmission Risk Interventions No flowsheet data found.

## 2021-01-05 NOTE — NC FL2 (Signed)
[place] LEVEL OF CARE SCREENING TOOL     IDENTIFICATION  Patient Name: Jasmine [name] Birthdate: 06-13-1955 Sex: female Admission Date (Current Location): 12/29/2020  [place] and [place] Number:  Engineering geologist and Address:  [facility], [address] [place], [place] [address]      Provider Number: [id number]  Attending Physician Name and Address:  No att. 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/29/2020   Food insecurity 12/01/2020   Weakness    Constipation 11/16/2020   Prolonged QT interval 11/16/2020   Aspiration pneumonia due to gastric secretions ([place])    Sepsis ([place]) 11/15/2020   CAP (community acquired pneumonia) XX123456   Acute metabolic encephalopathy XX123456   Essential hypertension 11/15/2020   Asthma 11/15/2020    Orientation RESPIRATION BLADDER Height & Weight     Self, Time, Place, Situation  Normal Continent Weight: 50 kg Height:  '5\' 5"'$  (165.1 cm)  BEHAVIORAL SYMPTOMS/MOOD NEUROLOGICAL BOWEL NUTRITION STATUS      Continent Diet  AMBULATORY STATUS COMMUNICATION OF NEEDS Skin   Supervision Librarian, academic) Verbally Normal                       Personal Care Assistance Level of Assistance  Bathing, Feeding, Dressing Bathing Assistance: Independent Feeding assistance: Independent Dressing Assistance: Independent     Functional Limitations Info  Sight, Hearing, Speech Sight Info: Adequate Hearing Info: Adequate Speech Info: Adequate    SPECIAL CARE FACTORS FREQUENCY                        Contractures Contractures Info: Not present    Additional Factors Info  Code Status, Allergies Code Status Info: Full Allergies Info: None           Current Medications (01/05/2021):  This is the current hospital active medication list Current Facility-Administered Medications  Medication Dose Route Frequency Provider Last Rate Last Admin   atorvastatin (LIPITOR) tablet 40 mg  40 mg Oral Daily Patrecia Pour, NP   40 mg at 01/04/21 0919   melatonin tablet 5 mg  5 mg Oral QHS PRN Ward, Delice Bison, DO       mometasone-formoterol (DULERA) 200-5 MCG/ACT inhaler 2 puff  2 puff Inhalation BID Ward, Kristen N, DO   2 puff at 01/04/21 2110   senna (SENOKOT) tablet 17.2 mg  2 tablet Oral Daily Ward, Kristen N, DO   17.2 mg at 01/04/21 0919   traZODone (DESYREL) tablet 150 mg  150 mg Oral QHS Ward, Kristen N, DO   150 mg at 01/04/21 2110   Current Outpatient Medications  Medication Sig Dispense Refill   atorvastatin (LIPITOR) 40 MG tablet Take 40 mg by mouth daily.     Fluticasone-Salmeterol (ADVAIR) 250-50 MCG/DOSE AEPB Inhale 1 puff into the lungs every 12 (twelve) hours.     melatonin 5 MG TABS Take 5 mg by mouth.     senna (SENOKOT) 8.6 MG TABS tablet Take 2 tablets by mouth in the morning and at bedtime.  traZODone (DESYREL) 150 MG tablet Take 150 mg by mouth at bedtime.     vitamin C (ASCORBIC ACID) 500 MG tablet Take 500 mg by mouth daily.     aspirin EC 81 MG tablet Take 81 mg by mouth daily. (Patient not taking: No sig reported)     docusate sodium (COLACE) 100 MG capsule Take 100 mg by mouth 2 (two) times daily. (Patient not taking: No sig reported)     magnesium hydroxide (MILK OF MAGNESIA) 400 MG/5ML suspension Take 5 mLs by mouth daily as needed for mild constipation. (Patient not taking: No sig reported)     polyethylene glycol (MIRALAX / GLYCOLAX) packet Take 17 g by mouth daily. (Patient not taking: No sig reported)       Discharge Medications: Please see discharge  summary for a list of discharge medications.  Relevant Imaging Results:  Relevant Lab Results:   Additional Information SS# [id number]  Kerin Salen, RN

## 2021-01-06 DIAGNOSIS — R456 Violent behavior: Secondary | ICD-10-CM | POA: Diagnosis not present

## 2021-01-06 NOTE — ED Notes (Signed)
Pt given dinner tray and drink. Pt requesting coca cola, informed pt that she could have water d/t drinking 4 cups of coca cola already throughout day, but pt refusing. No other needs voiced at this time.

## 2021-01-06 NOTE — ED Notes (Signed)
Pt awake and yelling out. Pt requesting drink. Provided water at this time

## 2021-01-06 NOTE — ED Notes (Signed)
Vol pending placement SW

## 2021-01-06 NOTE — ED Notes (Signed)
Pt asleep at this time, unable to collect vitals. Will collect pt vitals once awake. 

## 2021-01-06 NOTE — Progress Notes (Signed)
PT Cancellation Note  Patient Details Name: Jasmine [name] MRN: [id number] DOB: [birth year]   Cancelled Treatment:    Reason Eval/Treat Not Completed: Other (comment): PT eval performed 01/04/21 with RW recommended at discharge.  Pt assessed with ambulation while returning from bathroom. Amb assessed with both RW and QC with pt steady without LOB with the RW. Per nursing pt had been consistently ambulating to/from bathroom with RW without assistance. Pt not appropriate for continued skilled PT services secondary to consistent refusal to participate or follow commands/cues.  Will complete PT orders at this time.    Linus Salmons PT, DPT 01/06/21, 8:49 AM

## 2021-01-06 NOTE — ED Notes (Signed)
Pt given breakfast tray and juice at this time.

## 2021-01-06 NOTE — ED Provider Notes (Signed)
-----------------------------------------   5:07 AM on 01/06/2021 -----------------------------------------   Blood pressure 111/74, pulse 67, temperature 98 F (36.7 C), temperature source Oral, resp. rate 16, height '5\' 5"'$  (1.651 m), weight 50 kg, SpO2 98 %.  The patient is calm and cooperative at this time.  There have been no acute events since the last update.  Awaiting disposition plan from Social Work team.   Paulette Blanch, MD 01/06/21 [phone]

## 2021-01-06 NOTE — ED Notes (Signed)
Pt given lunch tray and coca cola at this time.

## 2021-01-07 DIAGNOSIS — R456 Violent behavior: Secondary | ICD-10-CM | POA: Diagnosis not present

## 2021-01-07 NOTE — ED Notes (Signed)
VOL/pending SW placement.

## 2021-01-07 NOTE — ED Notes (Signed)
Snack and beverage given. 

## 2021-01-07 NOTE — ED Notes (Signed)
Hourly rounding reveals patient in room. No complaints, stable, in no acute distress. Q15 minute rounds and monitoring via Rover and Officer to continue.   

## 2021-01-07 NOTE — ED Notes (Signed)
Report to include Situation, Background, Assessment, and Recommendations received from Amy RN. Patient alert and oriented, warm and dry, in no acute distress. Patient denies SI, HI, AVH and pain. Patient made aware of Q15 minute rounds and Rover and Officer presence for their safety. Patient instructed to come to me with needs or concerns.   

## 2021-01-07 NOTE — ED Provider Notes (Signed)
-----------------------------------------   5:18 AM on 01/07/2021 -----------------------------------------   Blood pressure 104/81, pulse 91, temperature 98.6 F (37 C), temperature source Oral, resp. rate 18, height '5\' 5"'$  (1.651 m), weight 50 kg, SpO2 100 %.  The patient is calm and cooperative at this time.  There have been no acute events since the last update.  Awaiting disposition plan from Social Work team.   Paulette Blanch, MD 01/07/21 [phone]

## 2021-01-08 DIAGNOSIS — R456 Violent behavior: Secondary | ICD-10-CM | POA: Diagnosis not present

## 2021-01-08 NOTE — ED Notes (Signed)
Pt hollering for a "hot blanket."  Pt already has numerous blankets on her bed to keep her warm.

## 2021-01-08 NOTE — ED Notes (Signed)
Vs assessed. Shower offered.

## 2021-01-08 NOTE — ED Notes (Signed)
Breakfast tray given. °

## 2021-01-08 NOTE — ED Notes (Signed)
VS not taken, patient asleep 

## 2021-01-08 NOTE — ED Notes (Signed)
Hourly rounding reveals patient in room. No complaints, stable, in no acute distress. Q15 minute rounds and monitoring via Rover and Officer to continue.   

## 2021-01-08 NOTE — ED Notes (Signed)
VOL, pending placement

## 2021-01-08 NOTE — ED Notes (Signed)
Vol pending sw placement

## 2021-01-09 DIAGNOSIS — R456 Violent behavior: Secondary | ICD-10-CM | POA: Diagnosis not present

## 2021-01-09 NOTE — ED Notes (Signed)
Given coke per request. Denies use of inhaler at this time.

## 2021-01-09 NOTE — ED Notes (Signed)
VOL/pending SW placement

## 2021-01-09 NOTE — ED Notes (Signed)
Pt's bedside cleaned and given coke

## 2021-01-09 NOTE — ED Notes (Signed)
Pt given dinner tray.

## 2021-01-09 NOTE — ED Notes (Signed)
Given drink. Pt prefers to take nighttime meds with drink.

## 2021-01-09 NOTE — ED Notes (Signed)
Vol pending s/w placement

## 2021-01-09 NOTE — ED Notes (Signed)
Pt given lunch tray.

## 2021-01-10 DIAGNOSIS — R456 Violent behavior: Secondary | ICD-10-CM | POA: Diagnosis not present

## 2021-01-10 NOTE — TOC Progression Note (Signed)
Transition of Care [facility]) - Progression Note    Patient Details  Name: Jasmine [name] MRN: [id number] Date of Birth: [birth year]  Transition of Care [facility]) CM/SW [place], [place] Phone Number: [phone] 01/10/2021, 3:06 PM  Clinical Narrative:     CSW sent FL2 to Jewel Baize w/ [facility] [email or web address], [phone].  Mr. Harrington Challenger stated he would send someone to meet with the patient tomorrow 01/11/2021.      Barriers to Discharge: Homeless with medical needs, Inadequate or no insurance, Family Issues, ED Uninsured needing placement-LOG  Expected Discharge Plan and Services                                                 Social Determinants of Health (SDOH) Interventions    Readmission Risk Interventions No flowsheet data found.

## 2021-01-10 NOTE — ED Notes (Signed)
Up to bathroom and back into room. Ambulates well with walker.

## 2021-01-10 NOTE — ED Provider Notes (Signed)
-----------------------------------------   6:23 AM on 01/10/2021 -----------------------------------------   Blood pressure 110/67, pulse 62, temperature 98.3 F (36.8 C), temperature source Oral, resp. rate 16, height 1.651 m ('5\' 5"'$ ), weight 50 kg, SpO2 98 %.  The patient is calm and cooperative at this time.  There have been no acute events since the last update.  Awaiting disposition plan from [facility] team.   Hinda Kehr, MD 01/10/21 [phone]

## 2021-01-10 NOTE — ED Notes (Signed)
Pt ambulated to bathroom 

## 2021-01-10 NOTE — ED Notes (Signed)
VOL, pending placement

## 2021-01-10 NOTE — ED Notes (Signed)
Will defer VS at this time due to patient sleeping.

## 2021-01-10 NOTE — ED Notes (Signed)
Patient walked to the restroom with walker and walked back to room. Patient is resting in bed at this time.

## 2021-01-10 NOTE — ED Notes (Signed)
Gave breakfast tray with juice.

## 2021-01-10 NOTE — ED Notes (Signed)
Report received from Ally, RN including SBAR. Patient alert and oriented, warm and dry, and in no acute distress. Patient denies SI, HI, AVH and pain. Patient made aware of Q15 minute rounds and Rover and Officer presence for their safety. Patient instructed to come to this nurse with needs or concerns.  

## 2021-01-11 DIAGNOSIS — R456 Violent behavior: Secondary | ICD-10-CM | POA: Diagnosis not present

## 2021-01-11 NOTE — ED Notes (Signed)
Gave breakfast tray with juice.

## 2021-01-11 NOTE — ED Notes (Signed)
Pt asleep at this time, unable to collect vitals. Will collect pt vitals once awake. 

## 2021-01-11 NOTE — ED Notes (Signed)
VOL SW  PLACEMENT

## 2021-01-11 NOTE — ED Notes (Signed)
Pt given dinner tray.

## 2021-01-11 NOTE — TOC Progression Note (Signed)
Transition of Care [facility]) - Progression Note    Patient Details  Name: NIKKA HAKIMIAN MRN: [id number] Date of Birth: Dec 25, 1955  Transition of Care [facility]) CM/SW [place], [place] Phone Number: [phone] 01/11/2021, 4:08 PM  Clinical Narrative:     Patient met with representatives with [facility] for placement assessment.  Jewel Baize w/ [facility] confirmed the pt has been accepted for placement.   Mr. Harrington Challenger stated he would contact Pleas Koch with DSS/APS to update her on placement offer and to establish payor source. CSW contacted Da[place] requesting she contact patient's daughter Ms. Ancil Linsey and update her on the bed offer, since she has an established relationship with her.    Barriers to Discharge: Homeless with medical needs, Inadequate or no insurance, Family Issues, ED Uninsured needing placement-LOG  Expected Discharge Plan and Services                                                 Social Determinants of Health (SDOH) Interventions    Readmission Risk Interventions No flowsheet data found.

## 2021-01-11 NOTE — ED Notes (Signed)
Pt heard yelling from room, pt is requesting a coke. Pt informed again of the food and drink rules of area. Pt ambulates with walker to restroom. Pt eats remaining dinner from tray that was in room. Pt also drinks from coke that was already in room. Pt room cleaned by this nurse. Back to bed at this time.

## 2021-01-11 NOTE — ED Notes (Signed)
Patient up to restroom with walker with minimal assistance. Patient was unable to make it to hallways restroom and urinated on self. Bed side commode placed in patient's room. Patient's clothing changed and patient cleansed with wipes.

## 2021-01-11 NOTE — ED Notes (Signed)
Staff from [place] in to interview patient at this time.

## 2021-01-11 NOTE — ED Notes (Signed)
Patient up to bedside commode independently

## 2021-01-11 NOTE — ED Notes (Signed)
This RN assisted pt to the bsc.

## 2021-01-12 DIAGNOSIS — R456 Violent behavior: Secondary | ICD-10-CM | POA: Diagnosis not present

## 2021-01-12 NOTE — TOC Progression Note (Addendum)
Transition of Care [facility]) - Progression Note    Patient Details  Name: Jasmine [name] MRN: [id number] Date of Birth: [birth year]  Transition of Care [facility]) CM/SW [place], [place] Phone Number: 01/12/2021, 1:36 PM  Clinical Narrative:     Janetta Hora [phone] from group home will come by today, to assess the patient between 1:30-2:00PM.    Barriers to Discharge: Homeless with medical needs, Inadequate or no insurance, Family Issues, ED Uninsured needing placement-LOG  Expected Discharge Plan and Services                                                 Social Determinants of Health (SDOH) Interventions    Readmission Risk Interventions No flowsheet data found.

## 2021-01-12 NOTE — ED Notes (Signed)
Pt ate 50% of dinner. Pt given graham crackers and sprite per request. Pt tolerating well. Pt ambulated to bathroom with walker. Gait steady.

## 2021-01-12 NOTE — TOC Progression Note (Signed)
Transition of Care [facility]) - Progression Note    Patient Details  Name: Jasmine [name] MRN: [id number] Date of Birth: 1956-01-18  Transition of Care [facility]) CM/SW [place], [place] Phone Number: [phone] 01/12/2021, 11:36 AM  Clinical Narrative:     CSW spoke with patient's Cherylee, [place] (Daughter) [phone] [facility] Phone) w/ update on group home placement offer at [facility].  Ms. Brosh stated she would prefer if the patient stayed in [place] and gave this CSW the contact information for Janetta Hora [phone], who told Ms. Murgo she had a female bed available in her group home.  CSW stated I would contact Ms. Primus Bravo, but explained to [name] if Ms. Primus Bravo delayed her bed offer and [facility] confirmed a placement date, I would need to send the patient to the [facility] in [place] because it was safe discharge.  Ms. Betanzos verbalized understanding.  CSW spoke with [place] DSS/APS [phone], [phone] and updated her on placement offer.  CSW called and left voicemail for Ms. Primus Bravo [phone] requesting confirmation on bed availability and a return call.    Barriers to Discharge: Homeless with medical needs, Inadequate or no insurance, Family Issues, ED Uninsured needing placement-LOG  Expected Discharge Plan and Services                                                 Social Determinants of Health (SDOH) Interventions    Readmission Risk Interventions No flowsheet data found.

## 2021-01-12 NOTE — ED Notes (Addendum)
Pt given breakfast tray and drink; placed at bedside d/t pt asleep at this time.

## 2021-01-12 NOTE — ED Notes (Signed)
Bedpan changed at this time. Small amount of urine in bedpan when this RN changed it.

## 2021-01-12 NOTE — ED Notes (Signed)
This RN given message by Network engineer from pt's family member, Varney Biles. This RN attempted call back at number given [phone], no answer and no way to leave a message due to phone mailbox being full per recording.

## 2021-01-12 NOTE — ED Notes (Signed)
Pt given lunch tray; placed at bedside d/t pt asleep at this time.

## 2021-01-12 NOTE — ED Notes (Signed)
Pts sister in to visit pt.

## 2021-01-12 NOTE — ED Notes (Signed)
This RN spoke with pt's daughter Varney Biles, who was concerned about pt not eating well and that she was told by her aunt the pt did not have a bedside table to eat from. Pt's daughter informed of policy that bedside tables cannot be in IVC pt rooms for safety reasons. Pt's daughter states understanding and this RN also informed daughter of visitation policy. Pt's daughter states that she would like to speak to SW about mother. Note sent to SW to call pt's daughter.

## 2021-01-12 NOTE — ED Provider Notes (Signed)
-----------------------------------------   5:50 AM on 01/12/2021 -----------------------------------------   Blood pressure 100/67, pulse 67, temperature 98.6 F (37 C), temperature source Oral, resp. rate 18, height '5\' 5"'$  (1.651 m), weight 50 kg, SpO2 100 %.  The patient is calm and cooperative at this time.  There have been no acute events since the last update.  Awaiting disposition plan from Social Work team.   Paulette Blanch, MD 01/12/21 [phone]

## 2021-01-12 NOTE — ED Notes (Signed)
Pt given dinner tray.

## 2021-01-13 DIAGNOSIS — R456 Violent behavior: Secondary | ICD-10-CM | POA: Diagnosis not present

## 2021-01-13 NOTE — ED Notes (Signed)
Pt given breakfast tray and drink

## 2021-01-13 NOTE — ED Notes (Signed)
Pt given lunch

## 2021-01-13 NOTE — ED Provider Notes (Signed)
Emergency Medicine Observation Re-evaluation Note  Jasmine [name] is a 65 y.o. female, seen on rounds today.  Pt initially presented to the ED for complaints of Aggressive Behavior Currently, the patient is sleeping.  Physical Exam  BP 106/76 (BP Location: Left Arm)   Pulse 63   Temp 98.3 F (36.8 C) (Oral)   Resp 18   Ht '5\' 5"'$  (1.651 m)   Wt 50 kg   SpO2 99%   BMI 18.34 kg/m  Physical Exam Gen: No acute distress  Resp: Normal rise and fall of chest Neuro: Moving all four extremities Psych: Resting currently, calm and cooperative when awake    ED Course / MDM  EKG:EKG Interpretation  Date/Time:  Thursday December 29 2020 17:23:52 EDT Ventricular Rate:  81 PR Interval:  142 QRS Duration: 70 QT Interval:  364 QTC Calculation: 422 R Axis:   6 Text Interpretation: Sinus rhythm with Fusion complexes Otherwise normal ECG Confirmed by UNCONFIRMED, DOCTOR ([id number]), editor Mel Almond, Tammy [phone]) on 12/30/2020 9:39:52 AM  I have reviewed the labs performed to date as well as medications administered while in observation.  Recent changes in the last 24 hours include no acute events.  Plan  Current plan is for social work placement.  Jasmine [name] is not under involuntary commitment.     Kenyatta Keidel, Delice Bison, DO 01/13/21 [phone]

## 2021-01-13 NOTE — ED Notes (Signed)
Pt given coca cola per request.

## 2021-01-13 NOTE — ED Notes (Signed)
Patient refused vitals.

## 2021-01-13 NOTE — ED Notes (Signed)
Pt given dinner, sister at bedside.

## 2021-01-14 DIAGNOSIS — R456 Violent behavior: Secondary | ICD-10-CM | POA: Diagnosis not present

## 2021-01-14 NOTE — ED Notes (Signed)
Pt ambulatory to bedside commode at this time. No acute distress noted.

## 2021-01-14 NOTE — ED Provider Notes (Signed)
Emergency Medicine Observation Re-evaluation Note  Jasmine [name] is a 65 y.o. female, seen on rounds today.  Pt initially presented to the ED for complaints of Aggressive Behavior Currently, the patient is calm, pleasantly confused, no acute concerns  Physical Exam  BP 106/82   Pulse 80   Temp 98.6 F (37 C) (Oral)   Resp 16   Ht '5\' 5"'$  (1.651 m)   Wt 50 kg   SpO2 98%   BMI 18.34 kg/m  Physical Exam General: no acute distress Lungs: equal chest rise Psych: calm  ED Course / MDM  EKG:EKG Interpretation  Date/Time:  Thursday December 29 2020 17:23:52 EDT Ventricular Rate:  81 PR Interval:  142 QRS Duration: 70 QT Interval:  364 QTC Calculation: 422 R Axis:   6 Text Interpretation: Sinus rhythm with Fusion complexes Otherwise normal ECG Confirmed by UNCONFIRMED, DOCTOR ([id number]), editor Mel Almond, Tammy [phone]) on 12/30/2020 9:39:52 AM  I have reviewed the labs performed to date as well as medications administered while in observation.  Recent changes in the last 24 hours include none.  Plan  Current plan is for social work dispo.  Jasmine [name] is not under involuntary commitment.     Lucrezia Starch, MD 01/14/21 1600

## 2021-01-14 NOTE — ED Notes (Signed)
Report to crystal, RN

## 2021-01-14 NOTE — ED Notes (Signed)
Pt given coke per request. 

## 2021-01-14 NOTE — ED Notes (Signed)
Pt has been up walking around with her walker multiple times and has had to be redirected to her bed. Pt provided with another roll of toilet tissue.

## 2021-01-14 NOTE — ED Notes (Signed)
Pt ambulatory with walker at this time unassisted.

## 2021-01-15 DIAGNOSIS — R456 Violent behavior: Secondary | ICD-10-CM | POA: Diagnosis not present

## 2021-01-15 NOTE — ED Notes (Signed)
Patient is resting comfortably with eyes closed. 

## 2021-01-15 NOTE — TOC Progression Note (Signed)
Transition of Care [facility]) - Progression Note    Patient Details  Name: Jasmine [name] MRN: [id number] Date of Birth: 12/14/1955  Transition of Care [facility]) CM/SW [place], [place] Phone Number: 01/15/2021, 10:21 AM  Clinical Narrative:     CSW lvm with Janetta Hora [phone] to determine outcomes of visit with patient, if able to accept at group home and bed availability status.   CSW also spoke with [facility] at [facility] in [place] who has accepted patient, he reports he is working with DSS regarding patient's funds prior to patient being able to admit to group home. Reports he is awaiting a call back from patient's DSS case worker and appreciates assistance from [facility] team coordinating this as well.   TOC to continue to follow for discharge planning and placement.      Barriers to Discharge: Homeless with medical needs, Inadequate or no insurance, Family Issues, ED Uninsured needing placement-LOG  Expected Discharge Plan and Services                                                 Social Determinants of Health (SDOH) Interventions    Readmission Risk Interventions No flowsheet data found.

## 2021-01-15 NOTE — ED Provider Notes (Signed)
Emergency Medicine Observation Re-evaluation Note  Jasmine [name] is a 65 y.o. female, seen on rounds today.  Pt initially presented to the ED for complaints of Aggressive Behavior Currently, the patient is sleeping.  Physical Exam  BP 135/90   Pulse 97   Temp 98.6 F (37 C) (Oral)   Resp 18   Ht '5\' 5"'$  (1.651 m)   Wt 50 kg   SpO2 98%   BMI 18.34 kg/m  Physical Exam Gen: No acute distress  Resp: Normal rise and fall of chest Neuro: Moving all four extremities Psych: Resting currently, calm and cooperative when awake    ED Course / MDM  EKG:EKG Interpretation  Date/Time:  Thursday December 29 2020 17:23:52 EDT Ventricular Rate:  81 PR Interval:  142 QRS Duration: 70 QT Interval:  364 QTC Calculation: 422 R Axis:   6 Text Interpretation: Sinus rhythm with Fusion complexes Otherwise normal ECG Confirmed by UNCONFIRMED, DOCTOR ([id number]), editor Mel Almond, Tammy [phone]) on 12/30/2020 9:39:52 AM  I have reviewed the labs performed to date as well as medications administered while in observation.  Recent changes in the last 24 hours include no acute events.  Plan  Current plan is for social work disposition.  MATTHEW DAGGETT is not under involuntary commitment.     Susie Ehresman, Delice Bison, DO 01/15/21 [phone]

## 2021-01-15 NOTE — ED Notes (Signed)
Patient is resting comfortably, patient is awake denies complaint.

## 2021-01-15 NOTE — ED Provider Notes (Signed)
Emergency Medicine Observation Re-evaluation Note  Jasmine [name] is a 65 y.o. female, seen on rounds today.    Physical Exam  BP 135/90   Pulse 97   Temp 98.6 F (37 C) (Oral)   Resp 18   Ht '5\' 5"'$  (1.651 m)   Wt 50 kg   SpO2 98%   BMI 18.34 kg/m  Physical Exam General: Patient resting comfortably in bed Lungs: Patient in no respiratory distress Psych: Patient not combative  ED Course / MDM  EKG:EKG Interpretation  Date/Time:  Thursday December 29 2020 17:23:52 EDT Ventricular Rate:  81 PR Interval:  142 QRS Duration: 70 QT Interval:  364 QTC Calculation: 422 R Axis:   6 Text Interpretation: Sinus rhythm with Fusion complexes Otherwise normal ECG Confirmed by UNCONFIRMED, DOCTOR ([id number]), editor Mel Almond, Tammy [phone]) on 12/30/2020 9:39:52 AM   Plan  Current plan is for social work placement  Jasmine [name] is not under involuntary commitment.     Nena Polio, MD 01/15/21 [phone]

## 2021-01-15 NOTE — ED Notes (Signed)
Patients bedside commode emptied and washed out

## 2021-01-15 NOTE — ED Notes (Signed)
Pt given ice cream per request.

## 2021-01-15 NOTE — ED Notes (Signed)
Patient is resting comfortably. 

## 2021-01-15 NOTE — ED Notes (Signed)
VOL/pending SW placement.

## 2021-01-16 DIAGNOSIS — R456 Violent behavior: Secondary | ICD-10-CM | POA: Diagnosis not present

## 2021-01-16 NOTE — ED Notes (Signed)
Pt continues to rest with RR even and unlabored. Will administer meds upon awakening

## 2021-01-16 NOTE — ED Notes (Signed)
Pt refused blood work  Dr Tamala Julian aware.

## 2021-01-16 NOTE — ED Notes (Signed)
VOL  PENDING  PLACEMENT 

## 2021-01-16 NOTE — ED Notes (Addendum)
Pt resting at this time. RR even and unlabored. Stretcher locked in lowest position.

## 2021-01-16 NOTE — ED Notes (Signed)
Resumed care from zach rn  Pt sleeping in hallway bed with covers over her head.

## 2021-01-16 NOTE — TOC Progression Note (Signed)
Transition of Care [facility]) - Progression Note    Patient Details  Name: Jasmine [name] MRN: [id number] Date of Birth: 1955/07/07  Transition of Care [facility]) CM/SW [place], [place] Phone Number: [phone] 01/16/2021, 2:15 PM  Clinical Narrative:     CSW left voicemail for Ronie Spies DSS Medicaid caseworker [phone], requesting a return call in reference to [place] for the patient.  Ms. Polly Cobia supervisor is Designer, fashion/clothing [phone]. Patient has bed offer at [facility] but they will not place her without [place] Medicaid confirmation.    Barriers to Discharge: Homeless with medical needs, Inadequate or no insurance, Family Issues, ED Uninsured needing placement-LOG  Expected Discharge Plan and Services                                                 Social Determinants of Health (SDOH) Interventions    Readmission Risk Interventions No flowsheet data found.

## 2021-01-16 NOTE — ED Notes (Signed)
Dietary contacted to send hot meal tray

## 2021-01-16 NOTE — ED Notes (Signed)
Pt is asleep with equal rise and fall of the chest.

## 2021-01-17 DIAGNOSIS — R456 Violent behavior: Secondary | ICD-10-CM | POA: Diagnosis not present

## 2021-01-17 NOTE — ED Notes (Signed)
Pt ate dinner tray.

## 2021-01-17 NOTE — ED Notes (Signed)
Pt ambulatory to restroom

## 2021-01-17 NOTE — ED Notes (Signed)
Pt eating dinner at this time

## 2021-01-17 NOTE — ED Notes (Signed)
Pt area cleaned of spilled drink, covered with blankets as requested

## 2021-01-17 NOTE — ED Notes (Signed)
Pt awake and yells out at staff.

## 2021-01-17 NOTE — ED Notes (Signed)
Pt ambulatory to restroom with walker 

## 2021-01-17 NOTE — ED Notes (Signed)
Pt noted to be smoking a vape in the hallway, advised this is not allowed. States she will not do it again, still has vape in [place]. Charge Mickel Baas RN made aware

## 2021-01-17 NOTE — ED Notes (Signed)
Breakfast tray at bedside, pt noted to have peed on floor, yelling for new pants and "I peed". Pt provided new underwear and clean pants.  Pt provided coke as requested.

## 2021-01-17 NOTE — ED Notes (Signed)
Pt eating lunch.  Bedside toilet emptied

## 2021-01-18 DIAGNOSIS — R456 Violent behavior: Secondary | ICD-10-CM | POA: Diagnosis not present

## 2021-01-18 NOTE — ED Notes (Signed)
Pt has visitor at bedside.

## 2021-01-18 NOTE — ED Notes (Signed)
Patient is resting comfortably with eyes closed. Respirations non labored and equal. Will continue to monitor for changes.

## 2021-01-18 NOTE — ED Provider Notes (Signed)
Emergency Medicine Observation Re-evaluation Note  Jasmine [name] is a 65 y.o. female, seen on rounds today.  Pt initially presented to the ED for complaints of Aggressive Behavior  Currently, the patient is is no acute distress.  Pt resting in bed   Physical Exam  Blood pressure 114/78, pulse (!) 52, temperature 98.5 F (36.9 C), temperature source Oral, resp. rate 17, height '5\' 5"'$  (1.651 m), weight 50 kg, SpO2 98 %.  Physical Exam General: pt resting Lungs: no increased WOB Psych: pt not combative, currently resting      ED Course / MDM     I have reviewed the labs performed to date as well as medications administered while in observation.  Recent changes in the last 24 hours include none   Plan   Current plan is to continue to wait social work -placement- issue with placement given homelessness.  Patient is not under full IVC at this time.   Vanessa Yaurel, MD 01/18/21 [phone]

## 2021-01-18 NOTE — ED Notes (Signed)
VOL  PENDING  PLACEMENT 

## 2021-01-18 NOTE — ED Notes (Addendum)
Pt resting at this time. RR even and unlabored. Will administer medications upon awakening Breakfast tray at bedside

## 2021-01-18 NOTE — ED Provider Notes (Signed)
Emergency Medicine Observation Re-evaluation Note  Jasmine [name] is a 65 y.o. female, seen on rounds today.  Physical Exam  BP 118/64 (BP Location: Left Arm)   Pulse (!) 53   Temp 98.1 F (36.7 C) (Oral)   Resp 16   Ht '5\' 5"'$  (1.651 m)   Wt 50 kg   SpO2 100%   BMI 18.34 kg/m  Physical Exam General: Patient sitting comfortably on bed Lungs: Patient in no respiratory distress Psych: Patient not combative  ED Course / MDM  EKG:  Plan  Current plan is for social work placement  SRAVANI DREY is not under involuntary commitment.     Nena Polio, MD 01/18/21 1505

## 2021-01-18 NOTE — Progress Notes (Signed)
[facility]) Hospital Liaison note:  This patient is currently enrolled in [facility] outpatient-based Palliative Care. Will continue to follow for disposition.  Please call with any outpatient palliative questions or concerns.  Thank you, Lorelee Market, LPN [facility] Liaison [phone]

## 2021-01-19 DIAGNOSIS — R456 Violent behavior: Secondary | ICD-10-CM | POA: Diagnosis not present

## 2021-01-19 NOTE — ED Notes (Signed)
Pt resting quietly at this time, eyes closed, respirations equal and unlabored. 

## 2021-01-19 NOTE — ED Notes (Signed)
VOL/Pending placement 

## 2021-01-19 NOTE — ED Notes (Signed)
Pt provided a coke

## 2021-01-19 NOTE — ED Notes (Signed)
Pt provided a walker to use the bedside commode.

## 2021-01-19 NOTE — ED Notes (Signed)
Pt resting comfortably at this time. Normal rise and fall of chest. NAD noted.  ?

## 2021-01-19 NOTE — ED Provider Notes (Signed)
Emergency Medicine Observation Re-evaluation Note  Jasmine [name] is a 65 y.o. female, seen on rounds today.  Pt initially presented to the ED for complaints of Aggressive Behavior Currently, the patient is sleeping.  Physical Exam  BP (!) 127/58 (BP Location: Left Arm)   Pulse 68   Temp 98.1 F (36.7 C) (Oral)   Resp 18   Ht '5\' 5"'$  (1.651 m)   Wt 50 kg   SpO2 99%   BMI 18.34 kg/m  Physical Exam Gen: No acute distress  Resp: Normal rise and fall of chest Neuro: Moving all four extremities Psych: Resting currently, calm and cooperative when awake    ED Course / MDM  EKG:EKG Interpretation  Date/Time:  Thursday December 29 2020 17:23:52 EDT Ventricular Rate:  81 PR Interval:  142 QRS Duration: 70 QT Interval:  364 QTC Calculation: 422 R Axis:   6 Text Interpretation: Sinus rhythm with Fusion complexes Otherwise normal ECG Confirmed by UNCONFIRMED, DOCTOR ([id number]), editor Mel Almond, Tammy [phone]) on 12/30/2020 9:39:52 AM  I have reviewed the labs performed to date as well as medications administered while in observation.  Recent changes in the last 24 hours include no acute events overnight.  Plan  Current plan is for social work disposition.  Jasmine [name] is not under involuntary commitment.     Cherissa Hook, Delice Bison, DO 01/19/21 [phone]

## 2021-01-19 NOTE — ED Notes (Signed)
DSS here speaking to pt. Pt encouraged to eat lunch but states "I'm tired'. NAD noted.

## 2021-01-19 NOTE — ED Notes (Signed)
Pt given breakfast tray. Pt up to bedside commode.

## 2021-01-20 DIAGNOSIS — R456 Violent behavior: Secondary | ICD-10-CM | POA: Diagnosis not present

## 2021-01-20 NOTE — Progress Notes (Unsigned)
NA

## 2021-01-20 NOTE — ED Notes (Signed)
Pt resting with eyes closed at this time.

## 2021-01-20 NOTE — ED Notes (Signed)
Report received from [facility]. Patient care assumed. Patient/RN introduction complete. Will continue to monitor.

## 2021-01-20 NOTE — ED Provider Notes (Signed)
-----------------------------------------   5:59 AM on 01/20/2021 -----------------------------------------   Blood pressure 128/66, pulse 66, temperature 98.3 F (36.8 C), temperature source Oral, resp. rate 16, height '5\' 5"'$  (1.651 m), weight 50 kg, SpO2 98 %.  The patient is calm and cooperative at this time.  There have been no acute events since the last update.  Awaiting disposition plan from Social Work team.   Paulette Blanch, MD 01/20/21 [phone]

## 2021-01-20 NOTE — ED Notes (Signed)
Pt given a warm blanket 

## 2021-01-21 DIAGNOSIS — R456 Violent behavior: Secondary | ICD-10-CM | POA: Diagnosis not present

## 2021-01-21 NOTE — ED Notes (Addendum)
Pt got up ambulated to toilet. Then back to bed and had dinner.

## 2021-01-21 NOTE — ED Notes (Signed)
Rn had to wake pt up to take 1000 am Meds. Then she went back to bed.

## 2021-01-21 NOTE — ED Notes (Signed)
Rn to bedside. Pt is sleeping. Will wait til she wakes up to obtain vitals.

## 2021-01-21 NOTE — ED Notes (Signed)
Pt up to use BSC, emptied, additional warm blanket given, no other needs at this time

## 2021-01-21 NOTE — ED Notes (Signed)
VOL/pending SW placement.

## 2021-01-21 NOTE — ED Provider Notes (Signed)
Emergency Medicine Observation Re-evaluation Note  Jasmine [name] is a 65 y.o. female, seen on rounds today.  Pt initially presented to the ED for complaints of Aggressive Behavior Currently, the patient is sleeping.  Physical Exam  BP 110/79   Pulse 69   Temp 98.7 F (37.1 C) (Oral)   Resp 18   Ht '5\' 5"'$  (1.651 m)   Wt 50 kg   SpO2 98%   BMI 18.34 kg/m  Physical Exam Gen: No acute distress  Resp: Normal rise and fall of chest Neuro: Moving all four extremities Psych: Resting currently, calm and cooperative when awake    ED Course / MDM  EKG:EKG Interpretation  Date/Time:  Thursday December 29 2020 17:23:52 EDT Ventricular Rate:  81 PR Interval:  142 QRS Duration: 70 QT Interval:  364 QTC Calculation: 422 R Axis:   6 Text Interpretation: Sinus rhythm with Fusion complexes Otherwise normal ECG Confirmed by UNCONFIRMED, DOCTOR ([id number]), editor Mel Almond, Tammy [phone]) on 12/30/2020 9:39:52 AM  I have reviewed the labs performed to date as well as medications administered while in observation.  Recent changes in the last 24 hours include no acute events overnight.  Plan  Current plan is for social work disposition.  Jasmine [name] is not under involuntary commitment.     Madelyn Tlatelpa, Delice Bison, DO 01/21/21 [phone]

## 2021-01-21 NOTE — ED Notes (Signed)
Pt sleeping. I asked her if she wanted to get up and she looked at me and said no.

## 2021-01-21 NOTE — ED Notes (Signed)
Asked pt if she wanted to get up and eat and she said no.

## 2021-01-22 DIAGNOSIS — R456 Violent behavior: Secondary | ICD-10-CM | POA: Diagnosis not present

## 2021-01-22 NOTE — ED Notes (Signed)
Vol /pending placement 

## 2021-01-22 NOTE — ED Notes (Signed)
Pt ambulatory to bedside commode with walker at this time. No assistance needed. Coke given to pt at this time.

## 2021-01-22 NOTE — ED Provider Notes (Signed)
-----------------------------------------   5:39 AM on 01/22/2021 -----------------------------------------   Blood pressure 127/76, pulse 61, temperature 98 F (36.7 C), resp. rate 15, height '5\' 5"'$  (1.651 m), weight 50 kg, SpO2 97 %.  The patient is calm and cooperative at this time.  There have been no acute events since the last update.  Awaiting disposition plan from Social Work team.   Paulette Blanch, MD 01/22/21 [phone]

## 2021-01-23 DIAGNOSIS — R456 Violent behavior: Secondary | ICD-10-CM | POA: Diagnosis not present

## 2021-01-23 NOTE — ED Notes (Signed)
Pt given some graham crackers and a soda.

## 2021-01-23 NOTE — ED Notes (Signed)
VOL, pending placement

## 2021-01-23 NOTE — ED Notes (Signed)
Pt up to bathroom and back in bed. RN attempted to get VS, pt states "why?" and refuses them at this time.

## 2021-01-23 NOTE — TOC Progression Note (Addendum)
Transition of Care [facility]) - Progression Note    Patient Details  Name: Jasmine [name] MRN: [id number] Date of Birth: 01-Apr-1956  Transition of Care [facility]) CM/SW [place], [place] Phone Number: [phone] 01/23/2021, 1:11 PM  Clinical Narrative:     CSW spoke with Ronie Spies with [place] Medicaid office. Ms. Felton Clinton stated the patiet does not have Special Assistance Medicaid and she would need her daughter to fill out the application on her behalf.  CSW called and left a voicemail for patients [place], [place] (Daughter) [phone] [facility]).    Barriers to Discharge: Homeless with medical needs, Inadequate or no insurance, Family Issues, ED Uninsured needing placement-LOG  Expected Discharge Plan and Services                                                 Social Determinants of Health (SDOH) Interventions    Readmission Risk Interventions No flowsheet data found.

## 2021-01-23 NOTE — ED Provider Notes (Signed)
Emergency Medicine Observation Re-evaluation Note  Jasmine [name] is a 65 y.o. female, seen on rounds today.  Pt initially presented to the ED for complaints of Aggressive Behavior Currently, the patient is resting, has not acute complaints.  Physical Exam  BP 131/85 (BP Location: Left Arm)   Pulse 60   Temp 98.5 F (36.9 C) (Oral)   Resp 16   Ht '5\' 5"'$  (1.651 m)   Wt 50 kg   SpO2 99%   BMI 18.34 kg/m  Physical Exam General: no acute distress Lungs: equal chest rise Psych: calm  ED Course / MDM  EKG:EKG Interpretation  Date/Time:  Thursday December 29 2020 17:23:52 EDT Ventricular Rate:  81 PR Interval:  142 QRS Duration: 70 QT Interval:  364 QTC Calculation: 422 R Axis:   6 Text Interpretation: Sinus rhythm with Fusion complexes Otherwise normal ECG Confirmed by UNCONFIRMED, DOCTOR ([id number]), editor Mel Almond, Tammy [phone]) on 12/30/2020 9:39:52 AM  I have reviewed the labs performed to date as well as medications administered while in observation.  Recent changes in the last 24 hours include none.  Plan  Current plan is for social work diso.  Jasmine [name] is not under involuntary commitment.     Jasmine Starch, MD 01/23/21 [phone]

## 2021-01-23 NOTE — ED Notes (Signed)
VOL/pending SW placement

## 2021-01-23 NOTE — ED Notes (Signed)
Pt up to bathroom with walker.

## 2021-01-23 NOTE — ED Notes (Signed)
Dietary called to send dinner roll up per pt request

## 2021-01-23 NOTE — ED Notes (Signed)
Resting quietly with eyes closed.

## 2021-01-23 NOTE — ED Notes (Signed)
Resumed care from kelly rn.  Pt resting quietly on bed.  Pt alert.  Dinner tray at bedside.

## 2021-01-23 NOTE — ED Provider Notes (Signed)
Emergency Medicine Observation Re-evaluation Note  Jasmine [name] is a 65 y.o. female, seen on rounds today.  Pt initially presented to the ED for complaints of Aggressive Behavior Currently, the patient is resting comfortably and patient has no acute complaints or concerns.  Physical Exam  BP 115/89 (BP Location: Left Arm)   Pulse (!) 57   Temp 98.5 F (36.9 C) (Oral)   Resp 16   Ht '5\' 5"'$  (1.651 m)   Wt 50 kg   SpO2 97%   BMI 18.34 kg/m  Physical Exam Gen: No acute distress  Resp: Normal rise and fall of chest Neuro: Moving all four extremities Psych: Resting currently, calm and cooperative when awake    ED Course / MDM  EKG:EKG Interpretation  Date/Time:  Thursday December 29 2020 17:23:52 EDT Ventricular Rate:  81 PR Interval:  142 QRS Duration: 70 QT Interval:  364 QTC Calculation: 422 R Axis:   6 Text Interpretation: Sinus rhythm with Fusion complexes Otherwise normal ECG Confirmed by UNCONFIRMED, DOCTOR ([id number]), editor Mel Almond, Tammy [phone]) on 12/30/2020 9:39:52 AM  I have reviewed the labs performed to date as well as medications administered while in observation.  Recent changes in the last 24 hours include no acute events overnight.  Plan  Current plan is for social work disposition for placement.  Patient is homeless.  Jasmine [name] is not under involuntary commitment.     Jasmine [name], Jasmine Bison, DO 01/23/21 0400

## 2021-01-24 ENCOUNTER — Other Ambulatory Visit: Payer: Self-pay

## 2021-01-24 DIAGNOSIS — R456 Violent behavior: Secondary | ICD-10-CM | POA: Diagnosis not present

## 2021-01-24 NOTE — TOC Progression Note (Signed)
Transition of Care [facility]) - Progression Note    Patient Details  Name: SOSIE LADE MRN: [id number] Date of Birth: December 11, 1955  Transition of Care [facility]) CM/SW [place], [place] Phone Number: [phone] 01/24/2021, 3:15 PM  Clinical Narrative:     CSW spoke with patient's daughter Encarnacion Burri [phone] and updated her on the conversation I had with Ronie Spies [facility] DSS Medicaid caseworker [phone].  CSW updated Ms. Reiling that she will need to contact Ms. Byrd and inquire about getting an appointment to apply for Special Assistance Medicaid for the patient to assist in group home placement. Ms. Bachtell stated she has just been diagnosed with COVID-19 and will need to quarantine for 5 days.  CSW stated I understood and encouraged [name] to contact Ms. Byrd at [facility], and inquire about making an appointment to begin the application process.  [facility] is only scheduling face-to-face appointments.Ms. Ernster verbalized agreement and stated she would contact this CSW with an update.     Barriers to Discharge: Homeless with medical needs, Inadequate or no insurance, Family Issues, ED Uninsured needing placement-LOG  Expected Discharge Plan and Services                                                 Social Determinants of Health (SDOH) Interventions    Readmission Risk Interventions No flowsheet data found.

## 2021-01-24 NOTE — ED Notes (Signed)
Pt sitting up in bed. Pts area cleaned of trash and new linen provided as well as refreshments. Pt with no request at this time.

## 2021-01-24 NOTE — ED Notes (Signed)
Pt resting in bed with even, unlabored RR. Bed at lowest position to floor. Assistive devices within reach of pt.

## 2021-01-24 NOTE — ED Notes (Signed)
Pt eating breakfast at this time.  

## 2021-01-24 NOTE — ED Notes (Signed)
Pt up for BM. Soiled products placed into dirt sink and flushed. Bedside commode cleaned and replaced at bedside. Pt back into bed with blankets in place and bed readjusted for comfort.

## 2021-01-24 NOTE — TOC Progression Note (Signed)
Transition of Care [facility]) - Progression Note    Patient Details  Name: TEKESHIA MATSUBARA MRN: [id number] Date of Birth: 08/14/1955  Transition of Care [facility]) CM/SW [place], [place] Phone Number: [phone] 01/24/2021, 2:32 PM  Clinical Narrative:     CSW left voicemail for Madelynn Done at [facility] [phone] requesting information on possible bed availability.     Barriers to Discharge: Homeless with medical needs, Inadequate or no insurance, Family Issues, ED Uninsured needing placement-LOG  Expected Discharge Plan and Services                                                 Social Determinants of Health (SDOH) Interventions    Readmission Risk Interventions No flowsheet data found.

## 2021-01-24 NOTE — ED Provider Notes (Signed)
Emergency Medicine Observation Re-evaluation Note  Jasmine [name] is a 65 y.o. female, seen on rounds today.  Pt initially presented to the ED for complaints of Aggressive Behavior Currently, the patient is sleeping comfortably.  Physical Exam  BP 109/77 (BP Location: Left Arm)   Pulse 82   Temp 98.2 F (36.8 C) (Oral)   Resp 17   Ht '5\' 5"'$  (1.651 m)   Wt 50 kg   SpO2 98%   BMI 18.34 kg/m  Physical Exam General: no acute distress Lungs: respirations even and unlabored Psych: no agitation  ED Course / MDM  EKG:EKG Interpretation  Date/Time:  Thursday December 29 2020 17:23:52 EDT Ventricular Rate:  81 PR Interval:  142 QRS Duration: 70 QT Interval:  364 QTC Calculation: 422 R Axis:   6 Text Interpretation: Sinus rhythm with Fusion complexes Otherwise normal ECG Confirmed by UNCONFIRMED, DOCTOR ([id number]), editor Mel Almond, Tammy [phone]) on 12/30/2020 9:39:52 AM  I have reviewed the labs performed to date as well as medications administered while in observation.  Recent changes in the last 24 hours include no changes.  Plan  Current plan is for SW placement.  Jasmine [name] is not under involuntary commitment.     Jasmine Hay, MD 01/24/21 [phone]

## 2021-01-24 NOTE — ED Notes (Signed)
Pt's bedside commode emptied at this time. Pt's extra linens removed. Dirty chux removed from under bedside commode. EVS called and coming to mop around pt's bedside area.  Pt resting at this time. Pt denies any needs.

## 2021-01-24 NOTE — ED Notes (Signed)
Pt given lunch tray at this time. Pt sleeping, respirations even and unlabored.

## 2021-01-24 NOTE — ED Provider Notes (Signed)
Emergency Medicine Observation Re-evaluation Note  Jasmine [name] is a 65 y.o. female, seen on rounds today.  Pt initially presented to the ED for complaints of Aggressive Behavior Currently, the patient is sleeping.  Physical Exam  BP 95/84 (BP Location: Left Arm)   Pulse 61   Temp 98.3 F (36.8 C) (Oral)   Resp 16   Ht '5\' 5"'$  (1.651 m)   Wt 50 kg   SpO2 98%   BMI 18.34 kg/m  Physical Exam Gen: No acute distress  Resp: Normal rise and fall of chest Neuro: Moving all four extremities Psych: Resting currently, calm and cooperative when awake    ED Course / MDM  EKG:EKG Interpretation  Date/Time:  Thursday December 29 2020 17:23:52 EDT Ventricular Rate:  81 PR Interval:  142 QRS Duration: 70 QT Interval:  364 QTC Calculation: 422 R Axis:   6 Text Interpretation: Sinus rhythm with Fusion complexes Otherwise normal ECG Confirmed by UNCONFIRMED, DOCTOR ([id number]), editor Mel Almond, Tammy [phone]) on 12/30/2020 9:39:52 AM  I have reviewed the labs performed to date as well as medications administered while in observation.  Recent changes in the last 24 hours include no acute events overnight.  Plan  Current plan is for social work disposition.  Jasmine [name] is not under involuntary commitment.     Cian Costanzo, Delice Bison, DO 01/24/21 [phone]

## 2021-01-24 NOTE — ED Notes (Signed)
Pt given dinner tray.

## 2021-01-24 NOTE — ED Notes (Signed)
This Rn offered to change pt's bed linens and shower for pt. Pt states she is fine and says "No thank you"

## 2021-01-24 NOTE — ED Notes (Signed)
Pt given cola 

## 2021-01-25 DIAGNOSIS — R456 Violent behavior: Secondary | ICD-10-CM | POA: Diagnosis not present

## 2021-01-25 NOTE — ED Notes (Signed)
Patient is resting comfortably, states all needs met at this time.

## 2021-01-25 NOTE — ED Provider Notes (Signed)
Emergency Medicine Observation Re-evaluation Note  Jasmine [name] is a 66 y.o. female, initially presented to the ED for complaints of Aggressive Behavior Currently, the patient is calm cooperative, no acute events.  Physical Exam  BP 102/66 (BP Location: Right Arm)   Pulse 71   Temp 98.1 F (36.7 C) (Oral)   Resp 16   Ht '5\' 5"'$  (1.651 m)   Wt 50 kg   SpO2 99%   BMI 18.34 kg/m   ED Course / MDM   No new lab work for review  Plan  Current plan is for placement to an appropriate living facility.  Jasmine [name] is not under involuntary commitment.     Harvest Dark, MD 01/25/21 [phone]

## 2021-01-25 NOTE — ED Notes (Signed)
Pt resting in NAD at this time.

## 2021-01-25 NOTE — ED Notes (Signed)
Pt given cola and ice per request

## 2021-01-25 NOTE — ED Notes (Signed)
Patient is resting comfortably. 

## 2021-01-25 NOTE — ED Notes (Signed)
Pt wakes easily. Given meds without trouble. Meal tray set up but pt states not hungry right now. Pt ambulatory with walker to bedside commode.

## 2021-01-25 NOTE — ED Notes (Signed)
Patient is resting comfortably. Food tray at bedside, pt states she will eat whenever she is hungry

## 2021-01-25 NOTE — ED Notes (Signed)
VOl/Pending Placement 

## 2021-01-25 NOTE — ED Notes (Signed)
Breakfast tray delivered. Pt remains resting.

## 2021-01-26 DIAGNOSIS — R456 Violent behavior: Secondary | ICD-10-CM | POA: Diagnosis not present

## 2021-01-26 NOTE — ED Notes (Signed)
Patient ate approx 1/2 small pancake and 2 bites of sausage. Refused to finish meal. Drinking cola, has cookies at bedside.

## 2021-01-26 NOTE — ED Notes (Signed)
Patients sister at bedside to visit patient, patient laying on bed.

## 2021-01-26 NOTE — ED Notes (Signed)
Patient ambulated with walker to and from restroom

## 2021-01-26 NOTE — ED Notes (Signed)
Patient refused to eat meal of chicken and peas, only agrees to eat roll and cookies. Drinking cola. Patient ambulatory to and from bed to restroom with walker.

## 2021-01-26 NOTE — ED Notes (Signed)
Patient still sleeping, awoken to give medications, patient assisted with cutting up food and provided with coke per request.

## 2021-01-26 NOTE — ED Notes (Signed)
VOL  PENDING  PLACEMENT 

## 2021-01-26 NOTE — ED Notes (Signed)
Pt is resting comfortably at this time.

## 2021-01-26 NOTE — TOC Progression Note (Signed)
Transition of Care [facility]) - Progression Note    Patient Details  Name: Jasmine [name] MRN: [id number] Date of Birth: 03/16/1956  Transition of Care [facility]) CM/SW [place], [place] Phone Number: 01/26/2021, 12:15 PM  Clinical Narrative:     CSW sent FL2 to [facility] attn: Madelynn Done [phone]    Barriers to Discharge: Homeless with medical needs, Inadequate or no insurance, Family Issues, ED Uninsured needing placement-LOG  Expected Discharge Plan and Services                                                 Social Determinants of Health (SDOH) Interventions    Readmission Risk Interventions No flowsheet data found.

## 2021-01-26 NOTE — ED Notes (Signed)
Patient refused to eat lunch, sister attempted to feed patient as well, refused to eat.

## 2021-01-27 DIAGNOSIS — R456 Violent behavior: Secondary | ICD-10-CM | POA: Diagnosis not present

## 2021-01-27 NOTE — ED Notes (Signed)
Dinner tray at bedside

## 2021-01-27 NOTE — ED Notes (Signed)
Patient sleeping in bed at this time. Respirations even and unlabored. No signs of distress.

## 2021-01-27 NOTE — ED Notes (Signed)
Lunch tray at bedside. ?

## 2021-01-27 NOTE — ED Notes (Signed)
Group home owner, Mr Jasmine [name] here evaluating patient for placement at this time.

## 2021-01-27 NOTE — ED Notes (Signed)
VOL/pending placement 

## 2021-01-27 NOTE — ED Notes (Signed)
Pt ambulated to the bedside toilet with walker at this time.

## 2021-01-27 NOTE — ED Notes (Signed)
Patient ate a few bites of breakfast. Refuses to eat anymore at this time. Patient resting in bed. Bed locked and in lowest position. Denies any needs at this time.

## 2021-01-27 NOTE — ED Notes (Signed)
Patient sleeping at this time. No signs of distress

## 2021-01-27 NOTE — ED Notes (Signed)
Patient given breakfast tray. Food opened up and set up for patient. RN offered to cut up food- patient declined. Patient denies any needs at this time. Patient sitting on side of bed eating tray.

## 2021-01-27 NOTE — TOC Progression Note (Signed)
Transition of Care [facility]) - Progression Note    Patient Details  Name: RENNETTE TOURE MRN: [id number] Date of Birth: 19-Feb-1956  Transition of Care [facility] CM/SW [place], [place] Phone Number: 01/27/2021, 8:46 AM  Clinical Narrative:     CSW spoke with Mr. Madelynn Done from [place] [phone] and he will come by to assess the patient around 12PM today.    Barriers to Discharge: Homeless with medical needs, Inadequate or no insurance, Family Issues, ED Uninsured needing placement-LOG  Expected Discharge Plan and Services                                                 Social Determinants of Health (SDOH) Interventions    Readmission Risk Interventions No flowsheet data found.

## 2021-01-27 NOTE — ED Notes (Signed)
Coke x2 provided

## 2021-01-28 DIAGNOSIS — R456 Violent behavior: Secondary | ICD-10-CM | POA: Diagnosis not present

## 2021-01-28 NOTE — ED Notes (Signed)
Pt given chocolate ice cream per request

## 2021-01-28 NOTE — ED Notes (Signed)
Pt care assumed at this time. Pt sleeping, arrousable, states all needs being met at this time.

## 2021-01-28 NOTE — ED Notes (Signed)
Pt. Provided with drink, blankets adjusted.

## 2021-01-28 NOTE — ED Notes (Signed)
Pt given coke and banana, per request

## 2021-01-28 NOTE — ED Notes (Signed)
Pt. Provided with drink, banana, blankets adjusted.

## 2021-01-28 NOTE — ED Notes (Signed)
Pt. Up to bedside commode indep. With walker. Pt. Provided with drink,cookies, blankets adjusted. NAD.

## 2021-01-29 DIAGNOSIS — R456 Violent behavior: Secondary | ICD-10-CM | POA: Diagnosis not present

## 2021-01-29 NOTE — ED Notes (Signed)
Pt resting at this time, Pt respiration are equal and unlabored.

## 2021-01-29 NOTE — TOC Progression Note (Signed)
Transition of Care [facility]) - Progression Note    Patient Details  Name: Jasmine [name] MRN: [id number] Date of Birth: July 21, 1955  Transition of Care [facility]) CM/SW [place], [place] Phone Number: [phone] 01/29/2021, 12:35 PM  Clinical Narrative:     CSW left voicemail for Mr. Madelynn Done from [facility] [phone], requesting update on group home placement decision.      Barriers to Discharge: Homeless with medical needs, Inadequate or no insurance, Family Issues, ED Uninsured needing placement-LOG  Expected Discharge Plan and Services                                                 Social Determinants of Health (SDOH) Interventions    Readmission Risk Interventions No flowsheet data found.

## 2021-01-29 NOTE — ED Notes (Signed)
Pt sleeping at this time.

## 2021-01-29 NOTE — ED Notes (Signed)
Pt family member took pt outside in wheelchair.

## 2021-01-29 NOTE — ED Notes (Signed)
Pt sleeping at this time. Respirations are equal and unlabored.

## 2021-01-29 NOTE — ED Notes (Signed)
Pts clothes changed.

## 2021-01-29 NOTE — ED Notes (Signed)
Pt family member at bedside. Pt family member requesting to take pt outside to sit for a little while. Per EDP D. Tamala Julian, [place] for family to take pt outside in a wheelchair to sit for a little while.

## 2021-01-29 NOTE — ED Notes (Signed)
VOL/pending placement 

## 2021-01-29 NOTE — ED Notes (Signed)
Po fluids provided to pt. Pt up to bedside commode.

## 2021-01-29 NOTE — TOC Progression Note (Signed)
Transition of Care [facility]) - Progression Note    Patient Details  Name: Jasmine [name] MRN: [id number] Date of Birth: September 07, 1955  Transition of Care [facility]) CM/SW [place], [place] Phone Number: 01/29/2021, 12:34 PM  Clinical Narrative:     CSW left voicemail for Mr. Madelynn Done from [facility] [phone], requesting update on group home placement decision.     Barriers to Discharge: Homeless with medical needs, Inadequate or no insurance, Family Issues, ED Uninsured needing placement-LOG  Expected Discharge Plan and Services                                                 Social Determinants of Health (SDOH) Interventions    Readmission Risk Interventions No flowsheet data found.

## 2021-01-30 ENCOUNTER — Emergency Department: Payer: Medicare Other

## 2021-01-30 ENCOUNTER — Other Ambulatory Visit: Payer: Medicare Other | Admitting: Primary Care

## 2021-01-30 DIAGNOSIS — R456 Violent behavior: Secondary | ICD-10-CM | POA: Diagnosis not present

## 2021-01-30 NOTE — ED Notes (Addendum)
Pt given meal tray.

## 2021-01-30 NOTE — ED Notes (Signed)
VOL  PENDING  PLACEMENT 

## 2021-01-30 NOTE — TOC Progression Note (Signed)
Transition of Care [facility]) - Progression Note    Patient Details  Name: Jasmine [name] MRN: [id number] Date of Birth: [birth year]  Transition of Care [facility]) CM/SW [place], [place] Phone Number: [phone] 01/30/2021, 11:52 AM  Clinical Narrative:     Patient has bed offer to [facility] [phone], [phone].  They will admit patient on Thursday 02/02/2021.CSW requested TB xray test, and will request COVID test on Wednesday 02/01/2021.  CSW called and left voicemail for Juleidy, [place] (Daughter) [phone] [facility] Phone) for update.     Barriers to Discharge: Homeless with medical needs, Inadequate or no insurance, Family Issues, ED Uninsured needing placement-LOG  Expected Discharge Plan and Services                                                 Social Determinants of Health (SDOH) Interventions    Readmission Risk Interventions No flowsheet data found.

## 2021-01-30 NOTE — ED Provider Notes (Signed)
-----------------------------------------   6:45 AM on 01/30/2021 -----------------------------------------   Blood pressure 108/84, pulse (!) 58, temperature 98 F (36.7 C), temperature source Oral, resp. rate 16, height '5\' 5"'$  (1.651 m), weight 50 kg, SpO2 97 %.  The patient is calm and cooperative at this time.  There have been no acute events since the last update.  Awaiting disposition plan from Social Work team.   Paulette Blanch, MD 01/30/21 [phone]

## 2021-01-30 NOTE — ED Notes (Signed)
Care transferred, report received from [place], [place]

## 2021-01-31 DIAGNOSIS — R456 Violent behavior: Secondary | ICD-10-CM | POA: Diagnosis not present

## 2021-01-31 NOTE — ED Notes (Signed)
Patient ambulatory to bedside commode.

## 2021-01-31 NOTE — ED Notes (Signed)
Patient ambulatory to and from bedside commode.

## 2021-01-31 NOTE — ED Notes (Signed)
Pt up to use the restroom

## 2021-01-31 NOTE — TOC Progression Note (Signed)
Transition of Care [facility]) - Progression Note    Patient Details  Name: Jasmine [name] MRN: [id number] Date of Birth: 21-Jun-1955  Transition of Care [facility]) CM/SW [place], [place] Phone Number: [phone] 01/31/2021, 12:44 PM  Clinical Narrative:     CSW spoke with patient's daughter [name], [name] (Daughter) [phone], updated her on family care home placement and d/c plan on 02/02/2021.  CSW gave [name] the contact information for Mr. Madelynn Done w/ family care home.    Barriers to Discharge: Homeless with medical needs, Inadequate or no insurance, Family Issues, ED Uninsured needing placement-LOG  Expected Discharge Plan and Services                                                 Social Determinants of Health (SDOH) Interventions    Readmission Risk Interventions No flowsheet data found.

## 2021-01-31 NOTE — ED Notes (Signed)
Patient provided with full linen change and clean pant/underwear/socks. Patient refused to let RN change shirt. Patient ambulatory to and from bedside commode with walker.

## 2021-01-31 NOTE — ED Notes (Signed)
VOL/pending placement to group home on 02/02/21

## 2021-01-31 NOTE — ED Notes (Signed)
Patient provided with ice and coke per request.

## 2021-01-31 NOTE — ED Notes (Signed)
Pt on side of bed. Pt has no questions at this time.

## 2021-01-31 NOTE — ED Notes (Signed)
Patient ate 1/2 english muffin, 1 banana and 1 strip bacon from breakfast tray.

## 2021-01-31 NOTE — ED Notes (Signed)
VOL/Pending [place] placement Thursday

## 2021-01-31 NOTE — ED Notes (Signed)
Pt in bed sleeping NADN

## 2021-01-31 NOTE — ED Notes (Signed)
Patient ambulatory in hallway with walker, to and from bedside commode.

## 2021-01-31 NOTE — ED Notes (Signed)
Patients family at bedside, provided with additional ice and blanket

## 2021-02-01 DIAGNOSIS — R456 Violent behavior: Secondary | ICD-10-CM | POA: Diagnosis not present

## 2021-02-01 NOTE — ED Provider Notes (Signed)
Emergency Medicine Observation Re-evaluation Note  Jasmine HICE is a 65 y.o. female, seen on rounds today.  Pt initially presented to the ED for complaints of Aggressive Behavior Currently, the patient is resting calmly, no acute concerns.  Physical Exam  BP 112/68   Pulse 74   Temp 98.3 F (36.8 C) (Oral)   Resp 15   Ht '5\' 5"'$  (1.651 m)   Wt 50 kg   SpO2 99%   BMI 18.34 kg/m  Physical Exam General: no acute distress Lungs: equal chest rise Psych: calm  ED Course / MDM  EKG:  I have reviewed the labs performed to date as well as medications administered while in observation.  Recent changes in the last 24 hours include none.  Plan  Current plan is for social work dispo.  SURIYA BALDY is not under involuntary commitment.     Lucrezia Starch, MD 02/01/21 [phone]

## 2021-02-01 NOTE — ED Notes (Signed)
Pt sleeping NADN 

## 2021-02-02 DIAGNOSIS — R456 Violent behavior: Secondary | ICD-10-CM | POA: Diagnosis not present

## 2021-02-02 NOTE — ED Notes (Signed)
pts daughter present, understands discharge instructions and has been in contact with our Education officer, [place]. Daughter given discharge packet.

## 2021-02-02 NOTE — ED Provider Notes (Signed)
-----------------------------------------   2:39 PM on 02/02/2021 ----------------------------------------- Social worker was able to find the patient [facility].  Patient's daughter will come pick her up around 3 PM today to bring her to the care home.  No recent lab work has been performed on the patient however reviewed the patient's lab work performed approximately 1 month ago and there are no concerning findings at that time.   Harvest Dark, MD 02/02/21 1440

## 2021-02-02 NOTE — TOC Progression Note (Signed)
Transition of Care [facility]) - Progression Note    Patient Details  Name: [name] MRN: [id number] Date of Birth: 1956-04-26  Transition of Care [facility]) CM/SW [place], [place] Phone Number: 02/02/2021, 11:52 AM  Clinical Narrative:     Pharmacy requested updated [facility] faxed to [phone] Attn: Iona Beard.  CSW sent FL2 signature request to EDP.    Barriers to Discharge: Homeless with medical needs, Inadequate or no insurance, Family Issues, ED Uninsured needing placement-LOG  Expected Discharge Plan and Services                                                 Social Determinants of Health (SDOH) Interventions    Readmission Risk Interventions No flowsheet data found.

## 2021-02-02 NOTE — Discharge Instructions (Addendum)
Please proceed directly to your care facility.  Return to the emergency department for any symptoms personally concerning to yourself or staff members.

## 2021-02-02 NOTE — TOC Progression Note (Addendum)
Transition of Care [facility]) - Progression Note    Patient Details  Name: Jasmine [name] MRN: [id number] Date of Birth: 29-Jul-1955  Transition of Care [facility]) CM/SW [place], [place] Phone Number: [phone] 02/02/2021, 8:53 AM  Clinical Narrative:      D/C plan is for patient to discharge to [facility]. Mr. Madelynn Done stated he would be able to admit her around 11:00AM.  CSW called Mr. Madelynn Done [phone], [phone] and left voicemail requesting confirmation on transportation for patient. EDP/ED Staff updated.    Barriers to Discharge: Homeless with medical needs, Inadequate or no insurance, Family Issues, ED Uninsured needing placement-LOG  Expected Discharge Plan and Services                                                 Social Determinants of Health (SDOH) Interventions    Readmission Risk Interventions No flowsheet data found.

## 2021-02-02 NOTE — TOC Transition Note (Signed)
Transition of Care [facility]) - CM/SW Discharge Note   Patient Details  Name: Jasmine [name] MRN: [id number] Date of Birth: 06-21-1955  Transition of Care [facility]) CM/SW Contact:  Ova [place] Phone Number: [phone] 02/02/2021, 10:13 AM   Clinical Narrative:     Patient will d/c to [facility].  Patient's daughter [name], [place] [id number] will pick up patient after 3:00PM, and transport patient to group home. EDP/ED Staff updated.  [facility] home updated.    Barriers to Discharge: Homeless with medical needs, Inadequate or no insurance, Family Issues, ED Uninsured needing placement-LOG   Patient Goals and CMS Choice        Discharge Placement                       Discharge Plan and Services                                     Social Determinants of Health (SDOH) Interventions     Readmission Risk Interventions No flowsheet data found.

## 2021-02-02 NOTE — NC FL2 (Signed)
[place] LEVEL OF CARE SCREENING TOOL     IDENTIFICATION  Patient Name: Jasmine [name] Birthdate: Feb 10, 1956 Sex: female Admission Date (Current Location): 12/29/2020  Center and [place] Number:  Selena Lesser [id number] [place] and Address:  [facility], [address], [place], [place] [address]      Provider Number: [id number]  Attending Physician Name and Address:  No att. 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 ([place]) 12/29/2020   Food insecurity 12/01/2020   Weakness    Constipation 11/16/2020   Prolonged QT interval 11/16/2020   Aspiration pneumonia due to gastric secretions ([place])    Sepsis ([place]) 11/15/2020   CAP (community acquired pneumonia) [id number]   Acute metabolic encephalopathy [id number]   Essential hypertension 11/15/2020   Asthma 11/15/2020    Orientation RESPIRATION BLADDER Height & Weight     Self, Time, Situation, Place  Normal Continent Weight: 110 lb 3.7 oz (50 kg) Height:  '5\' 5"'$  (165.1 cm)  BEHAVIORAL SYMPTOMS/MOOD NEUROLOGICAL BOWEL NUTRITION STATUS      Continent Diet  AMBULATORY STATUS COMMUNICATION OF NEEDS Skin   Supervision Verbally Normal                       Personal Care Assistance Level of Assistance  Bathing, Feeding, Dressing Bathing Assistance: Limited assistance Feeding assistance: Independent Dressing Assistance: Limited assistance     Functional Limitations Info  Sight, Hearing, Speech Sight Info: Adequate Hearing Info: Adequate Speech Info: Adequate    SPECIAL CARE  FACTORS FREQUENCY                       Contractures Contractures Info: Not present    Additional Factors Info  Code Status, Allergies Code Status Info: Full Allergies Info: None        Medicare A&B [id number]   Current Medications (02/02/2021):  This is the current hospital active medication list Current Facility-Administered Medications  Medication Dose Route Frequency Provider Last Rate Last Admin   atorvastatin (LIPITOR) tablet 40 mg  40 mg Oral Daily Patrecia Pour, NP   40 mg at 02/02/21 1103   melatonin tablet 5 mg  5 mg Oral QHS PRN Ward, Kristen N, DO   5 mg at 01/25/21 2350   mometasone-formoterol (DULERA) 200-5 MCG/ACT inhaler 2 puff  2 puff Inhalation BID Ward, Kristen N, DO   2 puff at 02/02/21 1103   senna (SENOKOT) tablet 17.2 mg  2 tablet Oral Daily Ward, Kristen N, DO   17.2 mg at 02/02/21 1103   traZODone (DESYREL) tablet 150 mg  150 mg Oral QHS Ward, Kristen N, DO   150 mg at 02/01/21 2340   Current Outpatient Medications  Medication Sig Dispense Refill   atorvastatin (LIPITOR) 40 MG tablet Take 40 mg by mouth daily.     Fluticasone-Salmeterol (ADVAIR) 250-50 MCG/DOSE AEPB Inhale 1 puff into the lungs every 12 (twelve) hours.     melatonin 5 MG TABS Take 5 mg by mouth.     senna (SENOKOT) 8.6 MG TABS tablet Take 2 tablets  by mouth in the morning and at bedtime.     traZODone (DESYREL) 150 MG tablet Take 150 mg by mouth at bedtime.     vitamin C (ASCORBIC ACID) 500 MG tablet Take 500 mg by mouth daily.     aspirin EC 81 MG tablet Take 81 mg by mouth daily. (Patient not taking: No sig reported)     docusate sodium (COLACE) 100 MG capsule Take 100 mg by mouth 2 (two) times daily. (Patient not taking: No sig reported)     magnesium hydroxide (MILK OF MAGNESIA) 400 MG/5ML suspension Take 5 mLs by mouth daily as needed for mild constipation. (Patient not taking: No sig reported)     polyethylene glycol (MIRALAX / GLYCOLAX) packet Take 17 g by mouth daily.  (Patient not taking: No sig reported)       Discharge Medications: Please see discharge summary for a list of discharge medications.  Relevant Imaging Results:  Relevant Lab Results:   Additional Information SS# [id number]  Adelene Amas, LCSWA

## 2021-02-28 ENCOUNTER — Telehealth: Payer: Self-pay | Admitting: Nurse Practitioner

## 2021-02-28 NOTE — Telephone Encounter (Signed)
After several phone calls previously made to daughter, Anyelin Mogle and also to Madelynn Done (Owner of [facility]) trying to arrange a Palliative f/u visit with patient I was able to schedule a Palliative visit for 03/08/21 @ 3:30 PM.  Notified Palliative Team.

## 2021-03-08 ENCOUNTER — Telehealth: Payer: Self-pay | Admitting: Nurse Practitioner

## 2021-03-08 ENCOUNTER — Other Ambulatory Visit: Payer: Medicare Other | Admitting: Nurse Practitioner

## 2021-03-08 ENCOUNTER — Other Ambulatory Visit: Payer: Self-pay

## 2021-03-08 NOTE — Telephone Encounter (Signed)
I attempted to contact the daughter, Sharai Overbay to confirm visit at [phone] to confirm pc scheduled initial visit. Ms. Tondreau endorses she was currently at work and asked to be contacted after 2:30pm today. Explained attempting to confirm initial pc visit today when Ms. Erdmann hung up.   I received a message to contact [phone] that Ms. Flaharty was in [place] which is outside [facility] service area currently. I called the number which was Therapist, nutritional of group home. Madelynn Done endorses Ms. Estabrook was moved to [facility] in [place]; Therapist, nutritional of care home; please cancel appointment for today and d/c from [facility]. The daughter owes him money for her stay and has not paid for the last month. Madelynn Done endorses he will let the daughter know [place] is currently out of our service area.

## 2022-02-03 IMAGING — DX DG CHEST 1V PORT
1 series · 1 of 1 positions shown · non-contrast
Comparison: Chest x-ray dated November 15, 2020

CLINICAL DATA: TB screening

EXAM:
PORTABLE CHEST 1 VIEW

[chest ap]
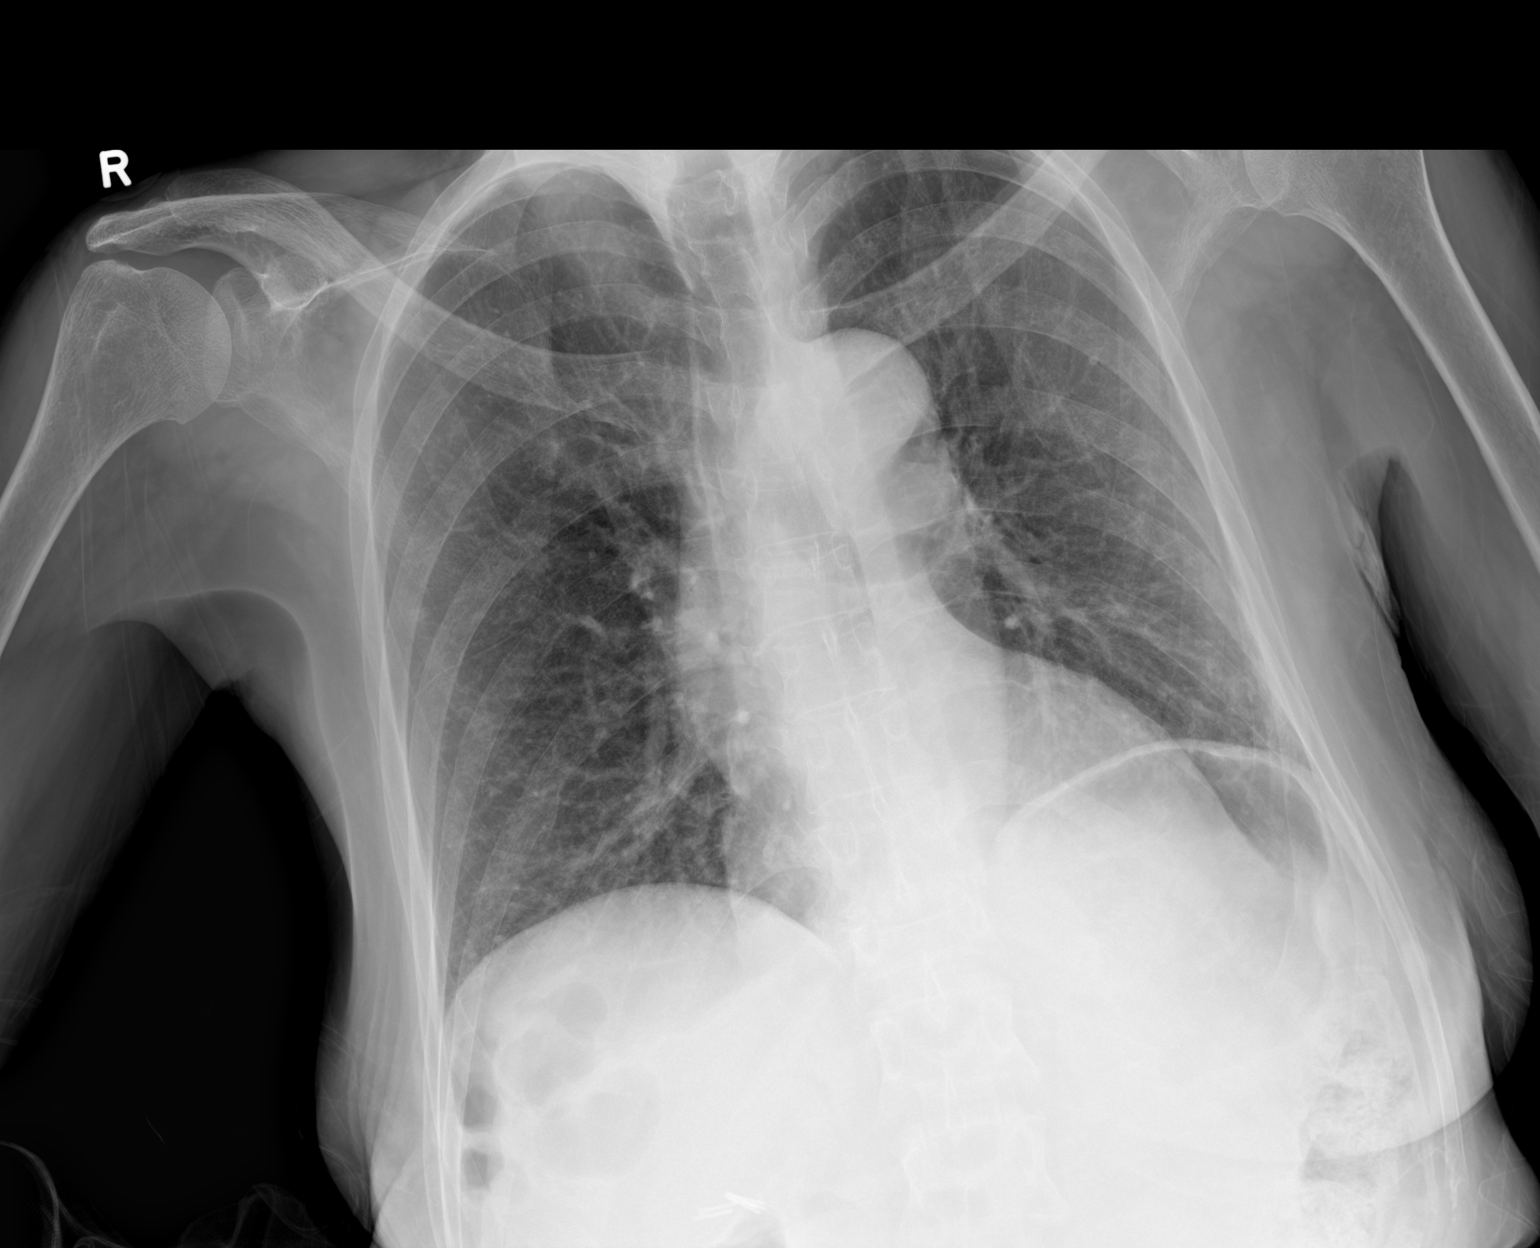

[1 of 1 positions shown; findings below may reference images not displayed]

FINDINGS: Cardiac and mediastinal contours are unchanged and within normal
limits. Lungs are clear. No pleural effusion pneumothorax.
IMPRESSION: No active disease.

## 2024-06-04 DEATH — deceased
